# Patient Record
Sex: Male | Born: 1965
Health system: Southern US, Community
[De-identification: ages and names within clinical notes are randomized; demographics above are authoritative.]

## PROBLEM LIST (undated history)

## (undated) DIAGNOSIS — E119 Type 2 diabetes mellitus without complications: Secondary | ICD-10-CM

---

## 2006-07-01 ENCOUNTER — Emergency Department (HOSPITAL_COMMUNITY): Admission: EM | Admit: 2006-07-01 | Discharge: 2006-07-01 | Payer: Self-pay | Admitting: Emergency Medicine

## 2010-02-27 ENCOUNTER — Emergency Department (HOSPITAL_COMMUNITY): Admission: EM | Admit: 2010-02-27 | Discharge: 2010-02-27 | Payer: Self-pay | Admitting: Family Medicine

## 2010-07-02 LAB — POCT URINALYSIS DIPSTICK
Glucose, UA: NEGATIVE mg/dL
Hgb urine dipstick: NEGATIVE
Nitrite: NEGATIVE
Protein, ur: NEGATIVE mg/dL
Urobilinogen, UA: 0.2 mg/dL (ref 0.0–1.0)

## 2010-07-02 LAB — POCT I-STAT, CHEM 8
Glucose, Bld: 122 mg/dL — ABNORMAL HIGH (ref 70–99)
HCT: 44 % (ref 39.0–52.0)
Hemoglobin: 15 g/dL (ref 13.0–17.0)
Potassium: 4 mEq/L (ref 3.5–5.1)
TCO2: 28 mmol/L (ref 0–100)

## 2010-07-02 LAB — HEMOCCULT GUIAC POC 1CARD (OFFICE): Fecal Occult Bld: POSITIVE

## 2011-06-28 SURGERY — APPENDECTOMY, LAPAROSCOPIC
Anesthesia: General

## 2012-10-28 ENCOUNTER — Other Ambulatory Visit: Payer: Self-pay | Admitting: Internal Medicine

## 2012-10-28 DIAGNOSIS — C220 Liver cell carcinoma: Secondary | ICD-10-CM

## 2012-11-04 ENCOUNTER — Other Ambulatory Visit: Payer: Self-pay

## 2012-11-08 ENCOUNTER — Ambulatory Visit
Admission: RE | Admit: 2012-11-08 | Discharge: 2012-11-08 | Disposition: A | Discharge status: Home / Self Care | Payer: No Typology Code available for payment source | Source: Ambulatory Visit | Attending: Internal Medicine | Admitting: Internal Medicine

## 2012-11-08 DIAGNOSIS — C220 Liver cell carcinoma: Secondary | ICD-10-CM

## 2015-01-01 DIAGNOSIS — B181 Chronic viral hepatitis B without delta-agent: Secondary | ICD-10-CM | POA: Diagnosis not present

## 2015-01-01 DIAGNOSIS — B182 Chronic viral hepatitis C: Secondary | ICD-10-CM | POA: Diagnosis not present

## 2015-01-03 ENCOUNTER — Other Ambulatory Visit (HOSPITAL_COMMUNITY): Payer: Self-pay | Admitting: Nurse Practitioner

## 2015-01-03 DIAGNOSIS — B182 Chronic viral hepatitis C: Secondary | ICD-10-CM

## 2015-01-03 DIAGNOSIS — B181 Chronic viral hepatitis B without delta-agent: Secondary | ICD-10-CM

## 2015-01-24 ENCOUNTER — Ambulatory Visit (HOSPITAL_COMMUNITY)
Admission: RE | Admit: 2015-01-24 | Discharge: 2015-01-24 | Disposition: A | Discharge status: Home / Self Care | Payer: Medicare Other | Source: Ambulatory Visit | Attending: Nurse Practitioner | Admitting: Nurse Practitioner

## 2015-01-24 DIAGNOSIS — B182 Chronic viral hepatitis C: Secondary | ICD-10-CM | POA: Insufficient documentation

## 2015-01-24 DIAGNOSIS — B181 Chronic viral hepatitis B without delta-agent: Secondary | ICD-10-CM | POA: Insufficient documentation

## 2015-02-06 DIAGNOSIS — B1920 Unspecified viral hepatitis C without hepatic coma: Secondary | ICD-10-CM | POA: Diagnosis not present

## 2015-02-06 DIAGNOSIS — F329 Major depressive disorder, single episode, unspecified: Secondary | ICD-10-CM | POA: Diagnosis not present

## 2015-02-06 DIAGNOSIS — E1165 Type 2 diabetes mellitus with hyperglycemia: Secondary | ICD-10-CM | POA: Diagnosis not present

## 2015-02-06 DIAGNOSIS — G629 Polyneuropathy, unspecified: Secondary | ICD-10-CM | POA: Diagnosis not present

## 2015-02-06 DIAGNOSIS — F4310 Post-traumatic stress disorder, unspecified: Secondary | ICD-10-CM | POA: Diagnosis not present

## 2015-02-06 DIAGNOSIS — Z23 Encounter for immunization: Secondary | ICD-10-CM | POA: Diagnosis not present

## 2015-02-21 ENCOUNTER — Ambulatory Visit (HOSPITAL_COMMUNITY): Payer: Self-pay

## 2015-05-11 DIAGNOSIS — F4310 Post-traumatic stress disorder, unspecified: Secondary | ICD-10-CM | POA: Diagnosis not present

## 2015-05-11 DIAGNOSIS — E119 Type 2 diabetes mellitus without complications: Secondary | ICD-10-CM | POA: Diagnosis not present

## 2015-05-11 DIAGNOSIS — B1920 Unspecified viral hepatitis C without hepatic coma: Secondary | ICD-10-CM | POA: Diagnosis not present

## 2015-05-11 DIAGNOSIS — Z7984 Long term (current) use of oral hypoglycemic drugs: Secondary | ICD-10-CM | POA: Diagnosis not present

## 2015-05-11 DIAGNOSIS — G629 Polyneuropathy, unspecified: Secondary | ICD-10-CM | POA: Diagnosis not present

## 2015-06-11 DIAGNOSIS — H524 Presbyopia: Secondary | ICD-10-CM | POA: Diagnosis not present

## 2015-06-11 DIAGNOSIS — E119 Type 2 diabetes mellitus without complications: Secondary | ICD-10-CM | POA: Diagnosis not present

## 2015-11-09 DIAGNOSIS — E1165 Type 2 diabetes mellitus with hyperglycemia: Secondary | ICD-10-CM | POA: Diagnosis not present

## 2015-12-04 ENCOUNTER — Other Ambulatory Visit: Payer: Self-pay | Admitting: Nurse Practitioner

## 2015-12-04 DIAGNOSIS — B181 Chronic viral hepatitis B without delta-agent: Secondary | ICD-10-CM | POA: Diagnosis not present

## 2015-12-04 DIAGNOSIS — K740 Hepatic fibrosis: Secondary | ICD-10-CM | POA: Diagnosis not present

## 2015-12-04 DIAGNOSIS — B182 Chronic viral hepatitis C: Secondary | ICD-10-CM | POA: Diagnosis not present

## 2015-12-05 ENCOUNTER — Other Ambulatory Visit: Payer: Self-pay | Admitting: Nurse Practitioner

## 2015-12-05 DIAGNOSIS — B1911 Unspecified viral hepatitis B with hepatic coma: Secondary | ICD-10-CM

## 2015-12-17 ENCOUNTER — Ambulatory Visit
Admission: RE | Admit: 2015-12-17 | Discharge: 2015-12-17 | Disposition: A | Discharge status: Home / Self Care | Payer: Medicare Other | Source: Ambulatory Visit | Attending: Nurse Practitioner | Admitting: Nurse Practitioner

## 2015-12-17 DIAGNOSIS — B1910 Unspecified viral hepatitis B without hepatic coma: Secondary | ICD-10-CM | POA: Diagnosis not present

## 2015-12-17 DIAGNOSIS — B1911 Unspecified viral hepatitis B with hepatic coma: Secondary | ICD-10-CM

## 2016-01-03 DIAGNOSIS — B181 Chronic viral hepatitis B without delta-agent: Secondary | ICD-10-CM | POA: Diagnosis not present

## 2016-01-03 DIAGNOSIS — K740 Hepatic fibrosis: Secondary | ICD-10-CM | POA: Diagnosis not present

## 2016-01-03 DIAGNOSIS — B182 Chronic viral hepatitis C: Secondary | ICD-10-CM | POA: Diagnosis not present

## 2016-02-15 DIAGNOSIS — B182 Chronic viral hepatitis C: Secondary | ICD-10-CM | POA: Diagnosis not present

## 2016-02-19 DIAGNOSIS — B182 Chronic viral hepatitis C: Secondary | ICD-10-CM | POA: Diagnosis not present

## 2016-02-19 DIAGNOSIS — B181 Chronic viral hepatitis B without delta-agent: Secondary | ICD-10-CM | POA: Diagnosis not present

## 2016-03-04 DIAGNOSIS — B182 Chronic viral hepatitis C: Secondary | ICD-10-CM | POA: Diagnosis not present

## 2016-03-04 DIAGNOSIS — B181 Chronic viral hepatitis B without delta-agent: Secondary | ICD-10-CM | POA: Diagnosis not present

## 2016-03-04 DIAGNOSIS — K740 Hepatic fibrosis: Secondary | ICD-10-CM | POA: Diagnosis not present

## 2016-03-19 DIAGNOSIS — B182 Chronic viral hepatitis C: Secondary | ICD-10-CM | POA: Diagnosis not present

## 2016-04-03 DIAGNOSIS — B182 Chronic viral hepatitis C: Secondary | ICD-10-CM | POA: Diagnosis not present

## 2016-06-06 DIAGNOSIS — E1165 Type 2 diabetes mellitus with hyperglycemia: Secondary | ICD-10-CM | POA: Diagnosis not present

## 2016-06-06 DIAGNOSIS — F4310 Post-traumatic stress disorder, unspecified: Secondary | ICD-10-CM | POA: Diagnosis not present

## 2016-06-06 DIAGNOSIS — E119 Type 2 diabetes mellitus without complications: Secondary | ICD-10-CM | POA: Diagnosis not present

## 2016-06-06 DIAGNOSIS — G629 Polyneuropathy, unspecified: Secondary | ICD-10-CM | POA: Diagnosis not present

## 2016-06-06 DIAGNOSIS — F329 Major depressive disorder, single episode, unspecified: Secondary | ICD-10-CM | POA: Diagnosis not present

## 2016-06-06 DIAGNOSIS — Z7984 Long term (current) use of oral hypoglycemic drugs: Secondary | ICD-10-CM | POA: Diagnosis not present

## 2016-06-23 DIAGNOSIS — E119 Type 2 diabetes mellitus without complications: Secondary | ICD-10-CM | POA: Diagnosis not present

## 2016-06-23 DIAGNOSIS — H524 Presbyopia: Secondary | ICD-10-CM | POA: Diagnosis not present

## 2016-07-17 ENCOUNTER — Other Ambulatory Visit: Payer: Self-pay | Admitting: Nurse Practitioner

## 2016-07-17 DIAGNOSIS — B182 Chronic viral hepatitis C: Secondary | ICD-10-CM | POA: Diagnosis not present

## 2016-07-17 DIAGNOSIS — B181 Chronic viral hepatitis B without delta-agent: Secondary | ICD-10-CM | POA: Diagnosis not present

## 2016-07-17 DIAGNOSIS — K740 Hepatic fibrosis: Secondary | ICD-10-CM | POA: Diagnosis not present

## 2016-07-25 ENCOUNTER — Ambulatory Visit
Admission: RE | Admit: 2016-07-25 | Discharge: 2016-07-25 | Disposition: A | Discharge status: Home / Self Care | Payer: Medicare Other | Source: Ambulatory Visit | Attending: Nurse Practitioner | Admitting: Nurse Practitioner

## 2016-07-25 DIAGNOSIS — B181 Chronic viral hepatitis B without delta-agent: Secondary | ICD-10-CM | POA: Diagnosis not present

## 2016-11-19 DIAGNOSIS — Z125 Encounter for screening for malignant neoplasm of prostate: Secondary | ICD-10-CM | POA: Diagnosis not present

## 2016-11-19 DIAGNOSIS — G629 Polyneuropathy, unspecified: Secondary | ICD-10-CM | POA: Diagnosis not present

## 2016-11-19 DIAGNOSIS — F4310 Post-traumatic stress disorder, unspecified: Secondary | ICD-10-CM | POA: Diagnosis not present

## 2016-11-19 DIAGNOSIS — B1920 Unspecified viral hepatitis C without hepatic coma: Secondary | ICD-10-CM | POA: Diagnosis not present

## 2016-11-19 DIAGNOSIS — E119 Type 2 diabetes mellitus without complications: Secondary | ICD-10-CM | POA: Diagnosis not present

## 2016-11-19 DIAGNOSIS — Z1211 Encounter for screening for malignant neoplasm of colon: Secondary | ICD-10-CM | POA: Diagnosis not present

## 2016-11-19 DIAGNOSIS — Z0000 Encounter for general adult medical examination without abnormal findings: Secondary | ICD-10-CM | POA: Diagnosis not present

## 2016-11-19 DIAGNOSIS — F329 Major depressive disorder, single episode, unspecified: Secondary | ICD-10-CM | POA: Diagnosis not present

## 2016-12-25 DIAGNOSIS — B182 Chronic viral hepatitis C: Secondary | ICD-10-CM | POA: Diagnosis not present

## 2016-12-25 DIAGNOSIS — B181 Chronic viral hepatitis B without delta-agent: Secondary | ICD-10-CM | POA: Diagnosis not present

## 2016-12-25 DIAGNOSIS — K740 Hepatic fibrosis: Secondary | ICD-10-CM | POA: Diagnosis not present

## 2016-12-26 ENCOUNTER — Other Ambulatory Visit: Payer: Self-pay | Admitting: Nurse Practitioner

## 2016-12-26 DIAGNOSIS — K7400 Hepatic fibrosis, unspecified: Secondary | ICD-10-CM

## 2016-12-26 DIAGNOSIS — B181 Chronic viral hepatitis B without delta-agent: Secondary | ICD-10-CM

## 2017-01-07 ENCOUNTER — Other Ambulatory Visit: Payer: Medicare Other

## 2017-01-20 ENCOUNTER — Ambulatory Visit
Admission: RE | Admit: 2017-01-20 | Discharge: 2017-01-20 | Disposition: A | Discharge status: Home / Self Care | Payer: Medicare Other | Source: Ambulatory Visit | Attending: Nurse Practitioner | Admitting: Nurse Practitioner

## 2017-01-20 DIAGNOSIS — K7400 Hepatic fibrosis, unspecified: Secondary | ICD-10-CM

## 2017-01-20 DIAGNOSIS — B181 Chronic viral hepatitis B without delta-agent: Secondary | ICD-10-CM | POA: Diagnosis not present

## 2017-01-26 DIAGNOSIS — B181 Chronic viral hepatitis B without delta-agent: Secondary | ICD-10-CM | POA: Diagnosis not present

## 2017-01-26 DIAGNOSIS — B182 Chronic viral hepatitis C: Secondary | ICD-10-CM | POA: Diagnosis not present

## 2017-02-20 DIAGNOSIS — K648 Other hemorrhoids: Secondary | ICD-10-CM | POA: Diagnosis not present

## 2017-02-20 DIAGNOSIS — K621 Rectal polyp: Secondary | ICD-10-CM | POA: Diagnosis not present

## 2017-02-20 DIAGNOSIS — Z1211 Encounter for screening for malignant neoplasm of colon: Secondary | ICD-10-CM | POA: Diagnosis not present

## 2017-02-24 DIAGNOSIS — Z1211 Encounter for screening for malignant neoplasm of colon: Secondary | ICD-10-CM | POA: Diagnosis not present

## 2017-02-24 DIAGNOSIS — K621 Rectal polyp: Secondary | ICD-10-CM | POA: Diagnosis not present

## 2017-03-06 DIAGNOSIS — Z23 Encounter for immunization: Secondary | ICD-10-CM | POA: Diagnosis not present

## 2017-03-18 DIAGNOSIS — B182 Chronic viral hepatitis C: Secondary | ICD-10-CM | POA: Diagnosis not present

## 2017-03-18 DIAGNOSIS — B181 Chronic viral hepatitis B without delta-agent: Secondary | ICD-10-CM | POA: Diagnosis not present

## 2017-03-25 DIAGNOSIS — B182 Chronic viral hepatitis C: Secondary | ICD-10-CM | POA: Diagnosis not present

## 2017-03-25 DIAGNOSIS — B181 Chronic viral hepatitis B without delta-agent: Secondary | ICD-10-CM | POA: Diagnosis not present

## 2017-04-28 DIAGNOSIS — B182 Chronic viral hepatitis C: Secondary | ICD-10-CM | POA: Diagnosis not present

## 2017-04-28 DIAGNOSIS — B181 Chronic viral hepatitis B without delta-agent: Secondary | ICD-10-CM | POA: Diagnosis not present

## 2017-05-08 DIAGNOSIS — B181 Chronic viral hepatitis B without delta-agent: Secondary | ICD-10-CM | POA: Diagnosis not present

## 2017-05-08 DIAGNOSIS — B182 Chronic viral hepatitis C: Secondary | ICD-10-CM | POA: Diagnosis not present

## 2017-07-01 DIAGNOSIS — G629 Polyneuropathy, unspecified: Secondary | ICD-10-CM | POA: Diagnosis not present

## 2017-07-01 DIAGNOSIS — Z7984 Long term (current) use of oral hypoglycemic drugs: Secondary | ICD-10-CM | POA: Diagnosis not present

## 2017-07-01 DIAGNOSIS — F329 Major depressive disorder, single episode, unspecified: Secondary | ICD-10-CM | POA: Diagnosis not present

## 2017-07-01 DIAGNOSIS — E119 Type 2 diabetes mellitus without complications: Secondary | ICD-10-CM | POA: Diagnosis not present

## 2017-07-01 DIAGNOSIS — E7800 Pure hypercholesterolemia, unspecified: Secondary | ICD-10-CM | POA: Diagnosis not present

## 2017-07-02 DIAGNOSIS — H538 Other visual disturbances: Secondary | ICD-10-CM | POA: Diagnosis not present

## 2017-07-02 DIAGNOSIS — E119 Type 2 diabetes mellitus without complications: Secondary | ICD-10-CM | POA: Diagnosis not present

## 2017-07-21 DIAGNOSIS — B181 Chronic viral hepatitis B without delta-agent: Secondary | ICD-10-CM | POA: Diagnosis not present

## 2017-07-21 DIAGNOSIS — B182 Chronic viral hepatitis C: Secondary | ICD-10-CM | POA: Diagnosis not present

## 2017-07-31 DIAGNOSIS — B182 Chronic viral hepatitis C: Secondary | ICD-10-CM | POA: Diagnosis not present

## 2017-07-31 DIAGNOSIS — K740 Hepatic fibrosis: Secondary | ICD-10-CM | POA: Diagnosis not present

## 2017-07-31 DIAGNOSIS — B181 Chronic viral hepatitis B without delta-agent: Secondary | ICD-10-CM | POA: Diagnosis not present

## 2017-08-03 ENCOUNTER — Other Ambulatory Visit: Payer: Self-pay | Admitting: Nurse Practitioner

## 2017-08-03 DIAGNOSIS — B181 Chronic viral hepatitis B without delta-agent: Secondary | ICD-10-CM

## 2017-08-13 ENCOUNTER — Other Ambulatory Visit: Payer: Medicare Other

## 2017-08-13 ENCOUNTER — Ambulatory Visit
Admission: RE | Admit: 2017-08-13 | Discharge: 2017-08-13 | Disposition: A | Discharge status: Home / Self Care | Payer: Medicare Other | Source: Ambulatory Visit | Attending: Nurse Practitioner | Admitting: Nurse Practitioner

## 2017-08-13 DIAGNOSIS — B181 Chronic viral hepatitis B without delta-agent: Secondary | ICD-10-CM

## 2017-11-23 DIAGNOSIS — B1920 Unspecified viral hepatitis C without hepatic coma: Secondary | ICD-10-CM | POA: Diagnosis not present

## 2017-11-23 DIAGNOSIS — F329 Major depressive disorder, single episode, unspecified: Secondary | ICD-10-CM | POA: Diagnosis not present

## 2017-11-23 DIAGNOSIS — E7800 Pure hypercholesterolemia, unspecified: Secondary | ICD-10-CM | POA: Diagnosis not present

## 2017-11-23 DIAGNOSIS — G629 Polyneuropathy, unspecified: Secondary | ICD-10-CM | POA: Diagnosis not present

## 2017-11-23 DIAGNOSIS — Z0000 Encounter for general adult medical examination without abnormal findings: Secondary | ICD-10-CM | POA: Diagnosis not present

## 2017-11-23 DIAGNOSIS — L299 Pruritus, unspecified: Secondary | ICD-10-CM | POA: Diagnosis not present

## 2017-11-23 DIAGNOSIS — E1165 Type 2 diabetes mellitus with hyperglycemia: Secondary | ICD-10-CM | POA: Diagnosis not present

## 2018-01-22 DIAGNOSIS — B181 Chronic viral hepatitis B without delta-agent: Secondary | ICD-10-CM | POA: Diagnosis not present

## 2018-01-22 DIAGNOSIS — B182 Chronic viral hepatitis C: Secondary | ICD-10-CM | POA: Diagnosis not present

## 2018-01-22 DIAGNOSIS — K740 Hepatic fibrosis: Secondary | ICD-10-CM | POA: Diagnosis not present

## 2018-03-30 DIAGNOSIS — B181 Chronic viral hepatitis B without delta-agent: Secondary | ICD-10-CM | POA: Diagnosis not present

## 2018-04-01 ENCOUNTER — Other Ambulatory Visit: Payer: Self-pay | Admitting: Nurse Practitioner

## 2018-04-01 DIAGNOSIS — B181 Chronic viral hepatitis B without delta-agent: Secondary | ICD-10-CM

## 2018-04-07 ENCOUNTER — Ambulatory Visit
Admission: RE | Admit: 2018-04-07 | Discharge: 2018-04-07 | Disposition: A | Discharge status: Home / Self Care | Payer: Medicare Other | Source: Ambulatory Visit | Attending: Nurse Practitioner | Admitting: Nurse Practitioner

## 2018-04-07 DIAGNOSIS — B181 Chronic viral hepatitis B without delta-agent: Secondary | ICD-10-CM | POA: Diagnosis not present

## 2018-05-26 DIAGNOSIS — E119 Type 2 diabetes mellitus without complications: Secondary | ICD-10-CM | POA: Diagnosis not present

## 2018-05-26 DIAGNOSIS — E7800 Pure hypercholesterolemia, unspecified: Secondary | ICD-10-CM | POA: Diagnosis not present

## 2018-05-26 DIAGNOSIS — Z23 Encounter for immunization: Secondary | ICD-10-CM | POA: Diagnosis not present

## 2018-05-26 DIAGNOSIS — F329 Major depressive disorder, single episode, unspecified: Secondary | ICD-10-CM | POA: Diagnosis not present

## 2018-05-26 DIAGNOSIS — G629 Polyneuropathy, unspecified: Secondary | ICD-10-CM | POA: Diagnosis not present

## 2018-07-05 DIAGNOSIS — H538 Other visual disturbances: Secondary | ICD-10-CM | POA: Diagnosis not present

## 2018-07-05 DIAGNOSIS — E119 Type 2 diabetes mellitus without complications: Secondary | ICD-10-CM | POA: Diagnosis not present

## 2018-07-07 ENCOUNTER — Other Ambulatory Visit: Payer: Self-pay

## 2018-07-07 ENCOUNTER — Encounter (HOSPITAL_COMMUNITY): Payer: Self-pay | Admitting: Emergency Medicine

## 2018-07-07 ENCOUNTER — Ambulatory Visit (HOSPITAL_COMMUNITY)
Admission: EM | Admit: 2018-07-07 | Discharge: 2018-07-07 | Disposition: A | Discharge status: Home / Self Care | Payer: Medicaid Other | Attending: Family Medicine | Admitting: Family Medicine

## 2018-07-07 DIAGNOSIS — J309 Allergic rhinitis, unspecified: Secondary | ICD-10-CM | POA: Diagnosis not present

## 2018-07-07 DIAGNOSIS — R05 Cough: Secondary | ICD-10-CM

## 2018-07-07 DIAGNOSIS — R21 Rash and other nonspecific skin eruption: Secondary | ICD-10-CM | POA: Diagnosis not present

## 2018-07-07 HISTORY — DX: Type 2 diabetes mellitus without complications: E11.9

## 2018-07-07 MED ORDER — CETIRIZINE-PSEUDOEPHEDRINE ER 5-120 MG PO TB12: 1.0000 | ORAL_TABLET | Freq: Every day | ORAL | 0 refills | Status: AC

## 2018-07-07 MED ORDER — PERMETHRIN 5 % EX CREA: TOPICAL_CREAM | CUTANEOUS | 1 refills | Status: AC

## 2018-07-07 MED ORDER — FLUTICASONE PROPIONATE 50 MCG/ACT NA SUSP: 1.0000 | Freq: Every day | NASAL | 2 refills | Status: AC

## 2018-07-07 MED ORDER — SALINE SPRAY 0.65 % NA SOLN
1.0000 | NASAL | 0 refills | Status: AC | PRN
Start: 2018-07-07 — End: ?

## 2018-07-07 NOTE — ED Triage Notes (Signed)
Pt c/o cough x5 days, and states sometimes he has nose bleeds.

## 2018-07-07 NOTE — ED Provider Notes (Signed)
[facility]    CSN: [id number] Arrival date & time: 07/07/18  0957     History   Chief Complaint Chief Complaint  Patient presents with  . Cough    HPI Drew Mathews is a 53 y.o. male.   Patient is a 53 year old male with past medical history of diabetes.  He presents with approximately 5 days of dry cough.  He is also had a scratchy, itchy throat, nasal congestion, rhinorrhea and mild nosebleeding.  Symptoms have been constant but waxing and waning.  He has not been take anything for his symptoms.  Denies any cough, chest congestion, fevers, chills, fatigue, night sweats, myalgias.  Denies any recent traveling or recent sick contacts.  Patient also has rash to bilateral lower extremities and left flank area.  This is been present over the past couple months.  Describes the rash as itchy.  He has not been putting anything on the rash.  He does a lot of of fishing and is outside a lot.   Denies any fever, joint pain. Denies any recent changes in lotions, detergents, foods or other possible irritants. No recent travel. Nobody else at home has the rash. Patient has been outside but denies any contact with plants or insects. No new foods or medications.   ROS per HPI      Cough    Past Medical History:  Diagnosis Date  . Diabetes mellitus without complication ([place])     There are no active problems to display for this patient.   History reviewed. No pertinent surgical history.     Home Medications    Prior to Admission medications   Medication Sig Start Date End Date Taking? Authorizing Provider  glipiZIDE (GLUCOTROL) 10 MG tablet Take by mouth. 09/26/13  Yes [provider]  cetirizine-pseudoephedrine (ZYRTEC-D) 5-120 MG tablet Take 1 tablet by mouth daily. 07/07/18   Zeddie Njie, Tressia Miners A, NP  fluticasone (FLONASE) 50 MCG/ACT nasal spray Place 1 spray into both nostrils daily. 07/07/18   Alfhild Partch, Tressia Miners A, NP  gabapentin (NEURONTIN) 300 MG capsule TAKE 1 CAPSULE BY  MOUTH THREE TIMES A DAY 05/26/18   [provider]  metFORMIN (GLUCOPHAGE) 1000 MG tablet Take by mouth.    [provider]  permethrin (ELIMITE) 5 % cream Apply to affected area once 07/07/18   Loura Halt A, NP  pravastatin (PRAVACHOL) 10 MG tablet Take 10 mg by mouth daily. 05/24/18   [provider]  sertraline (ZOLOFT) 100 MG tablet Take by mouth.    [provider]  sodium chloride (OCEAN) 0.65 % SOLN nasal spray Place 1 spray into both nostrils as needed for congestion. 07/07/18   Orvan July, NP    Family History Family History  Family history unknown: Yes    Social History Social History   Tobacco Use  . Smoking status: Never Smoker  Substance Use Topics  . Alcohol use: Never    Frequency: Never  . Drug use: Never     Allergies   Patient has no known allergies.   Review of Systems Review of Systems  Respiratory: Positive for cough.      Physical Exam Triage Vital Signs ED Triage Vitals  Enc Vitals Group     BP 07/07/18 1048 (!) 97/51     Pulse Rate 07/07/18 1048 69     Resp 07/07/18 1048 16     Temp 07/07/18 1048 97.7 F (36.5 C)     Temp Source 07/07/18 1048 Oral  SpO2 07/07/18 1048 100 %     Weight --      Height --      Head Circumference --      Peak Flow --      Pain Score 07/07/18 1054 6     Pain Loc --      Pain Edu? --      Excl. in [place]? --    No data found.  Updated Vital Signs BP (!) 97/51 (BP Location: Left Arm)   Pulse 69   Temp 97.7 F (36.5 C) (Oral)   Resp 16   SpO2 100%   Visual Acuity Right Eye Distance:   Left Eye Distance:   Bilateral Distance:    Right Eye Near:   Left Eye Near:    Bilateral Near:     Physical Exam Vitals signs and nursing note reviewed.  Constitutional:      General: He is not in acute distress.    Appearance: Normal appearance. He is normal weight. He is not ill-appearing, toxic-appearing or diaphoretic.  HENT:     Head: Normocephalic and atraumatic.      Right Ear: Tympanic membrane and ear canal normal.     Left Ear: Tympanic membrane and ear canal normal.     Nose: Congestion and rhinorrhea present.     Right Turbinates: Swollen.     Left Turbinates: Swollen and pale.     Mouth/Throat:     Comments: PND Eyes:     Conjunctiva/sclera: Conjunctivae normal.  Neck:     Musculoskeletal: Normal range of motion.  Cardiovascular:     Rate and Rhythm: Normal rate and regular rhythm.     Pulses: Normal pulses.     Heart sounds: Normal heart sounds.  Pulmonary:     Effort: Pulmonary effort is normal.     Breath sounds: Normal breath sounds.  Musculoskeletal: Normal range of motion.  Lymphadenopathy:     Cervical: No cervical adenopathy.  Skin:    General: Skin is warm and dry.     Findings: Rash present.     Comments: Located to bilateral lower extremities and left flank Rash on left flank papular with some pustules. Rash to bilateral lower extremities a mixture of papules, pustules and excoriations.  Neurological:     Mental Status: He is alert.  Psychiatric:        Mood and Affect: Mood normal.      UC Treatments / Results  Labs (all labs ordered are listed, but only abnormal results are displayed) Labs Reviewed - No data to display  EKG None  Radiology No results found.  Procedures Procedures (including critical care time)  Medications Ordered in UC Medications - No data to display  Initial Impression / Assessment and Plan / UC Course  I have reviewed the triage vital signs and the nursing notes.  Pertinent labs & imaging results that were available during my care of the patient were reviewed by me and considered in my medical decision making (see chart for details).     Symptoms consistent with allergic rhinitis Flonase and Zyrtec-D for symptoms Nasal saline spray for mucosal irritation and dryness  Rash consistent with scabies infestation We will treat with permethrin cream Instructions on how to use given  Follow up as needed for continued or worsening symptoms   Final Clinical Impressions(s) / UC Diagnoses   Final diagnoses:  Allergic rhinitis, unspecified seasonality, unspecified trigger  Rash and nonspecific skin eruption     Discharge Instructions  I believe that your symptoms are allergy related.  Zyrtec D for nasal congestion, runny nose and congestion.  Flonase nasal spray for nasal congestion and allergies You could also use some nasal saline spray to help moisten the nasal passages and prevent nose bleeding  I will give you some cream for the rash.  You need to apply the cream and leave it on 8 hours and then wash off You can repeat this in 2 weeks if the rash is still present Follow up as needed for continued or worsening symptoms    ED Prescriptions    Medication Sig Dispense Auth. Provider   fluticasone (FLONASE) 50 MCG/ACT nasal spray Place 1 spray into both nostrils daily. 16 g Hailey Stormer A, NP   cetirizine-pseudoephedrine (ZYRTEC-D) 5-120 MG tablet Take 1 tablet by mouth daily. 30 tablet Kaliya Shreiner A, NP   sodium chloride (OCEAN) 0.65 % SOLN nasal spray Place 1 spray into both nostrils as needed for congestion. 1 Bottle Ashtynn Berke A, NP   permethrin (ELIMITE) 5 % cream Apply to affected area once 60 g Loura Halt A, NP     Controlled Substance Prescriptions [place] Controlled Substance Registry consulted? Not Applicable   Orvan July, NP 07/07/18 1143

## 2018-07-07 NOTE — Discharge Instructions (Signed)
I believe that your symptoms are allergy related.  Zyrtec D for nasal congestion, runny nose and congestion.  Flonase nasal spray for nasal congestion and allergies You could also use some nasal saline spray to help moisten the nasal passages and prevent nose bleeding  I will give you some cream for the rash.  You need to apply the cream and leave it on 8 hours and then wash off You can repeat this in 2 weeks if the rash is still present Follow up as needed for continued or worsening symptoms

## 2018-11-09 ENCOUNTER — Other Ambulatory Visit: Payer: Self-pay | Admitting: Nurse Practitioner

## 2018-11-09 DIAGNOSIS — B181 Chronic viral hepatitis B without delta-agent: Secondary | ICD-10-CM | POA: Diagnosis not present

## 2018-11-09 DIAGNOSIS — R21 Rash and other nonspecific skin eruption: Secondary | ICD-10-CM | POA: Diagnosis not present

## 2018-11-24 ENCOUNTER — Ambulatory Visit
Admission: RE | Admit: 2018-11-24 | Discharge: 2018-11-24 | Disposition: A | Discharge status: Home / Self Care | Payer: Medicare HMO | Source: Ambulatory Visit | Attending: Nurse Practitioner | Admitting: Nurse Practitioner

## 2018-11-24 DIAGNOSIS — R932 Abnormal findings on diagnostic imaging of liver and biliary tract: Secondary | ICD-10-CM | POA: Diagnosis not present

## 2018-11-24 DIAGNOSIS — B181 Chronic viral hepatitis B without delta-agent: Secondary | ICD-10-CM

## 2018-11-26 DIAGNOSIS — Z0000 Encounter for general adult medical examination without abnormal findings: Secondary | ICD-10-CM | POA: Diagnosis not present

## 2018-11-26 DIAGNOSIS — G629 Polyneuropathy, unspecified: Secondary | ICD-10-CM | POA: Diagnosis not present

## 2018-11-26 DIAGNOSIS — R21 Rash and other nonspecific skin eruption: Secondary | ICD-10-CM | POA: Diagnosis not present

## 2018-11-26 DIAGNOSIS — F329 Major depressive disorder, single episode, unspecified: Secondary | ICD-10-CM | POA: Diagnosis not present

## 2018-11-26 DIAGNOSIS — E7800 Pure hypercholesterolemia, unspecified: Secondary | ICD-10-CM | POA: Diagnosis not present

## 2018-11-26 DIAGNOSIS — Z7984 Long term (current) use of oral hypoglycemic drugs: Secondary | ICD-10-CM | POA: Diagnosis not present

## 2018-11-26 DIAGNOSIS — B1920 Unspecified viral hepatitis C without hepatic coma: Secondary | ICD-10-CM | POA: Diagnosis not present

## 2018-11-26 DIAGNOSIS — E1169 Type 2 diabetes mellitus with other specified complication: Secondary | ICD-10-CM | POA: Diagnosis not present

## 2018-11-26 DIAGNOSIS — I10 Essential (primary) hypertension: Secondary | ICD-10-CM | POA: Diagnosis not present

## 2018-12-30 DIAGNOSIS — Z20828 Contact with and (suspected) exposure to other viral communicable diseases: Secondary | ICD-10-CM | POA: Diagnosis not present

## 2019-01-13 DIAGNOSIS — E7800 Pure hypercholesterolemia, unspecified: Secondary | ICD-10-CM | POA: Diagnosis not present

## 2019-01-13 DIAGNOSIS — Z7984 Long term (current) use of oral hypoglycemic drugs: Secondary | ICD-10-CM | POA: Diagnosis not present

## 2019-01-13 DIAGNOSIS — I10 Essential (primary) hypertension: Secondary | ICD-10-CM | POA: Diagnosis not present

## 2019-01-13 DIAGNOSIS — F329 Major depressive disorder, single episode, unspecified: Secondary | ICD-10-CM | POA: Diagnosis not present

## 2019-01-13 DIAGNOSIS — E1169 Type 2 diabetes mellitus with other specified complication: Secondary | ICD-10-CM | POA: Diagnosis not present

## 2019-01-19 DIAGNOSIS — Z20828 Contact with and (suspected) exposure to other viral communicable diseases: Secondary | ICD-10-CM | POA: Diagnosis not present

## 2019-02-18 DIAGNOSIS — Z7984 Long term (current) use of oral hypoglycemic drugs: Secondary | ICD-10-CM | POA: Diagnosis not present

## 2019-02-18 DIAGNOSIS — I10 Essential (primary) hypertension: Secondary | ICD-10-CM | POA: Diagnosis not present

## 2019-02-18 DIAGNOSIS — F329 Major depressive disorder, single episode, unspecified: Secondary | ICD-10-CM | POA: Diagnosis not present

## 2019-02-18 DIAGNOSIS — E7800 Pure hypercholesterolemia, unspecified: Secondary | ICD-10-CM | POA: Diagnosis not present

## 2019-02-18 DIAGNOSIS — E1169 Type 2 diabetes mellitus with other specified complication: Secondary | ICD-10-CM | POA: Diagnosis not present

## 2019-03-02 DIAGNOSIS — E7800 Pure hypercholesterolemia, unspecified: Secondary | ICD-10-CM | POA: Diagnosis not present

## 2019-03-02 DIAGNOSIS — E1169 Type 2 diabetes mellitus with other specified complication: Secondary | ICD-10-CM | POA: Diagnosis not present

## 2019-03-02 DIAGNOSIS — I10 Essential (primary) hypertension: Secondary | ICD-10-CM | POA: Diagnosis not present

## 2019-03-02 DIAGNOSIS — F329 Major depressive disorder, single episode, unspecified: Secondary | ICD-10-CM | POA: Diagnosis not present

## 2019-03-29 DIAGNOSIS — L281 Prurigo nodularis: Secondary | ICD-10-CM | POA: Diagnosis not present

## 2019-04-19 DIAGNOSIS — E7800 Pure hypercholesterolemia, unspecified: Secondary | ICD-10-CM | POA: Diagnosis not present

## 2019-04-19 DIAGNOSIS — Z7984 Long term (current) use of oral hypoglycemic drugs: Secondary | ICD-10-CM | POA: Diagnosis not present

## 2019-04-19 DIAGNOSIS — I10 Essential (primary) hypertension: Secondary | ICD-10-CM | POA: Diagnosis not present

## 2019-04-19 DIAGNOSIS — F329 Major depressive disorder, single episode, unspecified: Secondary | ICD-10-CM | POA: Diagnosis not present

## 2019-04-19 DIAGNOSIS — E1169 Type 2 diabetes mellitus with other specified complication: Secondary | ICD-10-CM | POA: Diagnosis not present

## 2019-05-05 DIAGNOSIS — I10 Essential (primary) hypertension: Secondary | ICD-10-CM | POA: Diagnosis not present

## 2019-05-05 DIAGNOSIS — F324 Major depressive disorder, single episode, in partial remission: Secondary | ICD-10-CM | POA: Diagnosis not present

## 2019-05-05 DIAGNOSIS — Z7984 Long term (current) use of oral hypoglycemic drugs: Secondary | ICD-10-CM | POA: Diagnosis not present

## 2019-05-05 DIAGNOSIS — E1169 Type 2 diabetes mellitus with other specified complication: Secondary | ICD-10-CM | POA: Diagnosis not present

## 2019-05-05 DIAGNOSIS — E7800 Pure hypercholesterolemia, unspecified: Secondary | ICD-10-CM | POA: Diagnosis not present

## 2019-06-01 DIAGNOSIS — E7800 Pure hypercholesterolemia, unspecified: Secondary | ICD-10-CM | POA: Diagnosis not present

## 2019-06-01 DIAGNOSIS — I10 Essential (primary) hypertension: Secondary | ICD-10-CM | POA: Diagnosis not present

## 2019-06-01 DIAGNOSIS — E1169 Type 2 diabetes mellitus with other specified complication: Secondary | ICD-10-CM | POA: Diagnosis not present

## 2019-06-01 DIAGNOSIS — Z7984 Long term (current) use of oral hypoglycemic drugs: Secondary | ICD-10-CM | POA: Diagnosis not present

## 2019-06-01 DIAGNOSIS — F324 Major depressive disorder, single episode, in partial remission: Secondary | ICD-10-CM | POA: Diagnosis not present

## 2019-06-06 DIAGNOSIS — B181 Chronic viral hepatitis B without delta-agent: Secondary | ICD-10-CM | POA: Diagnosis not present

## 2019-06-08 ENCOUNTER — Other Ambulatory Visit: Payer: Self-pay | Admitting: Nurse Practitioner

## 2019-06-08 DIAGNOSIS — B181 Chronic viral hepatitis B without delta-agent: Secondary | ICD-10-CM

## 2019-06-17 ENCOUNTER — Ambulatory Visit
Admission: RE | Admit: 2019-06-17 | Discharge: 2019-06-17 | Disposition: A | Discharge status: Home / Self Care | Payer: Medicare HMO | Source: Ambulatory Visit | Attending: Nurse Practitioner | Admitting: Nurse Practitioner

## 2019-06-17 DIAGNOSIS — K838 Other specified diseases of biliary tract: Secondary | ICD-10-CM | POA: Diagnosis not present

## 2019-06-17 DIAGNOSIS — B181 Chronic viral hepatitis B without delta-agent: Secondary | ICD-10-CM

## 2019-06-27 DIAGNOSIS — L281 Prurigo nodularis: Secondary | ICD-10-CM | POA: Diagnosis not present

## 2019-07-01 DIAGNOSIS — I10 Essential (primary) hypertension: Secondary | ICD-10-CM | POA: Diagnosis not present

## 2019-07-01 DIAGNOSIS — F324 Major depressive disorder, single episode, in partial remission: Secondary | ICD-10-CM | POA: Diagnosis not present

## 2019-07-01 DIAGNOSIS — E1169 Type 2 diabetes mellitus with other specified complication: Secondary | ICD-10-CM | POA: Diagnosis not present

## 2019-07-01 DIAGNOSIS — E7800 Pure hypercholesterolemia, unspecified: Secondary | ICD-10-CM | POA: Diagnosis not present

## 2019-07-01 DIAGNOSIS — Z7984 Long term (current) use of oral hypoglycemic drugs: Secondary | ICD-10-CM | POA: Diagnosis not present

## 2019-07-29 DIAGNOSIS — I10 Essential (primary) hypertension: Secondary | ICD-10-CM | POA: Diagnosis not present

## 2019-07-29 DIAGNOSIS — E1169 Type 2 diabetes mellitus with other specified complication: Secondary | ICD-10-CM | POA: Diagnosis not present

## 2019-07-29 DIAGNOSIS — E7800 Pure hypercholesterolemia, unspecified: Secondary | ICD-10-CM | POA: Diagnosis not present

## 2019-07-29 DIAGNOSIS — F324 Major depressive disorder, single episode, in partial remission: Secondary | ICD-10-CM | POA: Diagnosis not present

## 2019-07-29 DIAGNOSIS — Z7984 Long term (current) use of oral hypoglycemic drugs: Secondary | ICD-10-CM | POA: Diagnosis not present

## 2019-09-01 DIAGNOSIS — E7800 Pure hypercholesterolemia, unspecified: Secondary | ICD-10-CM | POA: Diagnosis not present

## 2019-09-01 DIAGNOSIS — E1169 Type 2 diabetes mellitus with other specified complication: Secondary | ICD-10-CM | POA: Diagnosis not present

## 2019-09-01 DIAGNOSIS — F324 Major depressive disorder, single episode, in partial remission: Secondary | ICD-10-CM | POA: Diagnosis not present

## 2019-09-01 DIAGNOSIS — I10 Essential (primary) hypertension: Secondary | ICD-10-CM | POA: Diagnosis not present

## 2019-09-16 DIAGNOSIS — H52203 Unspecified astigmatism, bilateral: Secondary | ICD-10-CM | POA: Diagnosis not present

## 2019-09-16 DIAGNOSIS — E119 Type 2 diabetes mellitus without complications: Secondary | ICD-10-CM | POA: Diagnosis not present

## 2019-10-07 DIAGNOSIS — E1169 Type 2 diabetes mellitus with other specified complication: Secondary | ICD-10-CM | POA: Diagnosis not present

## 2019-10-07 DIAGNOSIS — E7800 Pure hypercholesterolemia, unspecified: Secondary | ICD-10-CM | POA: Diagnosis not present

## 2019-10-07 DIAGNOSIS — F324 Major depressive disorder, single episode, in partial remission: Secondary | ICD-10-CM | POA: Diagnosis not present

## 2019-10-07 DIAGNOSIS — I10 Essential (primary) hypertension: Secondary | ICD-10-CM | POA: Diagnosis not present

## 2019-10-11 DIAGNOSIS — E1169 Type 2 diabetes mellitus with other specified complication: Secondary | ICD-10-CM | POA: Diagnosis not present

## 2019-11-04 DIAGNOSIS — I10 Essential (primary) hypertension: Secondary | ICD-10-CM | POA: Diagnosis not present

## 2019-11-04 DIAGNOSIS — E7800 Pure hypercholesterolemia, unspecified: Secondary | ICD-10-CM | POA: Diagnosis not present

## 2019-11-04 DIAGNOSIS — E1169 Type 2 diabetes mellitus with other specified complication: Secondary | ICD-10-CM | POA: Diagnosis not present

## 2019-11-04 DIAGNOSIS — F324 Major depressive disorder, single episode, in partial remission: Secondary | ICD-10-CM | POA: Diagnosis not present

## 2019-11-30 DIAGNOSIS — I10 Essential (primary) hypertension: Secondary | ICD-10-CM | POA: Diagnosis not present

## 2019-11-30 DIAGNOSIS — B1920 Unspecified viral hepatitis C without hepatic coma: Secondary | ICD-10-CM | POA: Diagnosis not present

## 2019-11-30 DIAGNOSIS — Z0000 Encounter for general adult medical examination without abnormal findings: Secondary | ICD-10-CM | POA: Diagnosis not present

## 2019-11-30 DIAGNOSIS — E1169 Type 2 diabetes mellitus with other specified complication: Secondary | ICD-10-CM | POA: Diagnosis not present

## 2019-11-30 DIAGNOSIS — E7800 Pure hypercholesterolemia, unspecified: Secondary | ICD-10-CM | POA: Diagnosis not present

## 2019-11-30 DIAGNOSIS — G629 Polyneuropathy, unspecified: Secondary | ICD-10-CM | POA: Diagnosis not present

## 2019-11-30 DIAGNOSIS — F324 Major depressive disorder, single episode, in partial remission: Secondary | ICD-10-CM | POA: Diagnosis not present

## 2019-11-30 DIAGNOSIS — F4310 Post-traumatic stress disorder, unspecified: Secondary | ICD-10-CM | POA: Diagnosis not present

## 2019-12-02 DIAGNOSIS — F324 Major depressive disorder, single episode, in partial remission: Secondary | ICD-10-CM | POA: Diagnosis not present

## 2019-12-02 DIAGNOSIS — E1169 Type 2 diabetes mellitus with other specified complication: Secondary | ICD-10-CM | POA: Diagnosis not present

## 2019-12-02 DIAGNOSIS — I10 Essential (primary) hypertension: Secondary | ICD-10-CM | POA: Diagnosis not present

## 2019-12-02 DIAGNOSIS — E7800 Pure hypercholesterolemia, unspecified: Secondary | ICD-10-CM | POA: Diagnosis not present

## 2019-12-05 ENCOUNTER — Other Ambulatory Visit: Payer: Self-pay | Admitting: Nurse Practitioner

## 2019-12-05 DIAGNOSIS — B181 Chronic viral hepatitis B without delta-agent: Secondary | ICD-10-CM | POA: Diagnosis not present

## 2019-12-05 DIAGNOSIS — K7400 Hepatic fibrosis, unspecified: Secondary | ICD-10-CM | POA: Diagnosis not present

## 2020-01-05 DIAGNOSIS — F324 Major depressive disorder, single episode, in partial remission: Secondary | ICD-10-CM | POA: Diagnosis not present

## 2020-01-05 DIAGNOSIS — E1169 Type 2 diabetes mellitus with other specified complication: Secondary | ICD-10-CM | POA: Diagnosis not present

## 2020-01-05 DIAGNOSIS — E7800 Pure hypercholesterolemia, unspecified: Secondary | ICD-10-CM | POA: Diagnosis not present

## 2020-01-05 DIAGNOSIS — I10 Essential (primary) hypertension: Secondary | ICD-10-CM | POA: Diagnosis not present

## 2020-01-20 ENCOUNTER — Ambulatory Visit
Admission: RE | Admit: 2020-01-20 | Discharge: 2020-01-20 | Disposition: A | Discharge status: Home / Self Care | Payer: Medicare HMO | Source: Ambulatory Visit | Attending: Nurse Practitioner | Admitting: Nurse Practitioner

## 2020-01-20 DIAGNOSIS — B181 Chronic viral hepatitis B without delta-agent: Secondary | ICD-10-CM

## 2020-01-20 DIAGNOSIS — B1910 Unspecified viral hepatitis B without hepatic coma: Secondary | ICD-10-CM | POA: Diagnosis not present

## 2020-02-16 DIAGNOSIS — I10 Essential (primary) hypertension: Secondary | ICD-10-CM | POA: Diagnosis not present

## 2020-02-16 DIAGNOSIS — F324 Major depressive disorder, single episode, in partial remission: Secondary | ICD-10-CM | POA: Diagnosis not present

## 2020-02-16 DIAGNOSIS — E7800 Pure hypercholesterolemia, unspecified: Secondary | ICD-10-CM | POA: Diagnosis not present

## 2020-02-16 DIAGNOSIS — E1169 Type 2 diabetes mellitus with other specified complication: Secondary | ICD-10-CM | POA: Diagnosis not present

## 2020-03-07 DIAGNOSIS — E1169 Type 2 diabetes mellitus with other specified complication: Secondary | ICD-10-CM | POA: Diagnosis not present

## 2020-03-07 DIAGNOSIS — I10 Essential (primary) hypertension: Secondary | ICD-10-CM | POA: Diagnosis not present

## 2020-03-07 DIAGNOSIS — F324 Major depressive disorder, single episode, in partial remission: Secondary | ICD-10-CM | POA: Diagnosis not present

## 2020-03-07 DIAGNOSIS — E7800 Pure hypercholesterolemia, unspecified: Secondary | ICD-10-CM | POA: Diagnosis not present

## 2020-03-27 DIAGNOSIS — I10 Essential (primary) hypertension: Secondary | ICD-10-CM | POA: Diagnosis not present

## 2020-03-27 DIAGNOSIS — E1169 Type 2 diabetes mellitus with other specified complication: Secondary | ICD-10-CM | POA: Diagnosis not present

## 2020-03-27 DIAGNOSIS — F324 Major depressive disorder, single episode, in partial remission: Secondary | ICD-10-CM | POA: Diagnosis not present

## 2020-03-27 DIAGNOSIS — E7800 Pure hypercholesterolemia, unspecified: Secondary | ICD-10-CM | POA: Diagnosis not present

## 2020-04-30 DIAGNOSIS — I10 Essential (primary) hypertension: Secondary | ICD-10-CM | POA: Diagnosis not present

## 2020-04-30 DIAGNOSIS — E1169 Type 2 diabetes mellitus with other specified complication: Secondary | ICD-10-CM | POA: Diagnosis not present

## 2020-04-30 DIAGNOSIS — F324 Major depressive disorder, single episode, in partial remission: Secondary | ICD-10-CM | POA: Diagnosis not present

## 2020-04-30 DIAGNOSIS — E7800 Pure hypercholesterolemia, unspecified: Secondary | ICD-10-CM | POA: Diagnosis not present

## 2020-05-25 DIAGNOSIS — M79661 Pain in right lower leg: Secondary | ICD-10-CM | POA: Diagnosis not present

## 2020-05-25 DIAGNOSIS — M5441 Lumbago with sciatica, right side: Secondary | ICD-10-CM | POA: Diagnosis not present

## 2020-05-30 DIAGNOSIS — F324 Major depressive disorder, single episode, in partial remission: Secondary | ICD-10-CM | POA: Diagnosis not present

## 2020-05-30 DIAGNOSIS — E1169 Type 2 diabetes mellitus with other specified complication: Secondary | ICD-10-CM | POA: Diagnosis not present

## 2020-05-30 DIAGNOSIS — E7800 Pure hypercholesterolemia, unspecified: Secondary | ICD-10-CM | POA: Diagnosis not present

## 2020-05-30 DIAGNOSIS — I10 Essential (primary) hypertension: Secondary | ICD-10-CM | POA: Diagnosis not present

## 2020-06-01 DIAGNOSIS — E7800 Pure hypercholesterolemia, unspecified: Secondary | ICD-10-CM | POA: Diagnosis not present

## 2020-06-01 DIAGNOSIS — F324 Major depressive disorder, single episode, in partial remission: Secondary | ICD-10-CM | POA: Diagnosis not present

## 2020-06-01 DIAGNOSIS — Z23 Encounter for immunization: Secondary | ICD-10-CM | POA: Diagnosis not present

## 2020-06-01 DIAGNOSIS — I10 Essential (primary) hypertension: Secondary | ICD-10-CM | POA: Diagnosis not present

## 2020-06-01 DIAGNOSIS — Z7984 Long term (current) use of oral hypoglycemic drugs: Secondary | ICD-10-CM | POA: Diagnosis not present

## 2020-06-01 DIAGNOSIS — E1169 Type 2 diabetes mellitus with other specified complication: Secondary | ICD-10-CM | POA: Diagnosis not present

## 2020-06-21 DIAGNOSIS — E7800 Pure hypercholesterolemia, unspecified: Secondary | ICD-10-CM | POA: Diagnosis not present

## 2020-06-21 DIAGNOSIS — E1169 Type 2 diabetes mellitus with other specified complication: Secondary | ICD-10-CM | POA: Diagnosis not present

## 2020-06-21 DIAGNOSIS — F324 Major depressive disorder, single episode, in partial remission: Secondary | ICD-10-CM | POA: Diagnosis not present

## 2020-06-21 DIAGNOSIS — I10 Essential (primary) hypertension: Secondary | ICD-10-CM | POA: Diagnosis not present

## 2020-07-04 DIAGNOSIS — K760 Fatty (change of) liver, not elsewhere classified: Secondary | ICD-10-CM | POA: Diagnosis not present

## 2020-07-04 DIAGNOSIS — B181 Chronic viral hepatitis B without delta-agent: Secondary | ICD-10-CM | POA: Diagnosis not present

## 2020-07-04 DIAGNOSIS — K7401 Hepatic fibrosis, early fibrosis: Secondary | ICD-10-CM | POA: Diagnosis not present

## 2020-07-05 ENCOUNTER — Other Ambulatory Visit: Payer: Self-pay | Admitting: Nurse Practitioner

## 2020-07-05 DIAGNOSIS — B181 Chronic viral hepatitis B without delta-agent: Secondary | ICD-10-CM

## 2020-07-20 ENCOUNTER — Ambulatory Visit
Admission: RE | Admit: 2020-07-20 | Discharge: 2020-07-20 | Disposition: A | Discharge status: Home / Self Care | Payer: Medicare HMO | Source: Ambulatory Visit | Attending: Nurse Practitioner | Admitting: Nurse Practitioner

## 2020-07-20 DIAGNOSIS — B181 Chronic viral hepatitis B without delta-agent: Secondary | ICD-10-CM | POA: Diagnosis not present

## 2020-07-25 DIAGNOSIS — E7800 Pure hypercholesterolemia, unspecified: Secondary | ICD-10-CM | POA: Diagnosis not present

## 2020-07-25 DIAGNOSIS — F324 Major depressive disorder, single episode, in partial remission: Secondary | ICD-10-CM | POA: Diagnosis not present

## 2020-07-25 DIAGNOSIS — E1169 Type 2 diabetes mellitus with other specified complication: Secondary | ICD-10-CM | POA: Diagnosis not present

## 2020-07-25 DIAGNOSIS — I10 Essential (primary) hypertension: Secondary | ICD-10-CM | POA: Diagnosis not present

## 2020-08-21 DIAGNOSIS — E1169 Type 2 diabetes mellitus with other specified complication: Secondary | ICD-10-CM | POA: Diagnosis not present

## 2020-08-21 DIAGNOSIS — I10 Essential (primary) hypertension: Secondary | ICD-10-CM | POA: Diagnosis not present

## 2020-08-21 DIAGNOSIS — F324 Major depressive disorder, single episode, in partial remission: Secondary | ICD-10-CM | POA: Diagnosis not present

## 2020-08-21 DIAGNOSIS — E7800 Pure hypercholesterolemia, unspecified: Secondary | ICD-10-CM | POA: Diagnosis not present

## 2020-09-27 DIAGNOSIS — F324 Major depressive disorder, single episode, in partial remission: Secondary | ICD-10-CM | POA: Diagnosis not present

## 2020-09-27 DIAGNOSIS — I10 Essential (primary) hypertension: Secondary | ICD-10-CM | POA: Diagnosis not present

## 2020-09-27 DIAGNOSIS — E7800 Pure hypercholesterolemia, unspecified: Secondary | ICD-10-CM | POA: Diagnosis not present

## 2020-09-27 DIAGNOSIS — E1169 Type 2 diabetes mellitus with other specified complication: Secondary | ICD-10-CM | POA: Diagnosis not present

## 2020-10-10 DIAGNOSIS — H5203 Hypermetropia, bilateral: Secondary | ICD-10-CM | POA: Diagnosis not present

## 2020-10-10 DIAGNOSIS — E119 Type 2 diabetes mellitus without complications: Secondary | ICD-10-CM | POA: Diagnosis not present

## 2020-10-10 DIAGNOSIS — H52203 Unspecified astigmatism, bilateral: Secondary | ICD-10-CM | POA: Diagnosis not present

## 2020-10-10 DIAGNOSIS — H524 Presbyopia: Secondary | ICD-10-CM | POA: Diagnosis not present

## 2020-10-24 DIAGNOSIS — I10 Essential (primary) hypertension: Secondary | ICD-10-CM | POA: Diagnosis not present

## 2020-10-24 DIAGNOSIS — F324 Major depressive disorder, single episode, in partial remission: Secondary | ICD-10-CM | POA: Diagnosis not present

## 2020-10-24 DIAGNOSIS — E1169 Type 2 diabetes mellitus with other specified complication: Secondary | ICD-10-CM | POA: Diagnosis not present

## 2020-10-24 DIAGNOSIS — E7800 Pure hypercholesterolemia, unspecified: Secondary | ICD-10-CM | POA: Diagnosis not present

## 2020-11-22 DIAGNOSIS — E1169 Type 2 diabetes mellitus with other specified complication: Secondary | ICD-10-CM | POA: Diagnosis not present

## 2020-11-22 DIAGNOSIS — I10 Essential (primary) hypertension: Secondary | ICD-10-CM | POA: Diagnosis not present

## 2020-11-22 DIAGNOSIS — F324 Major depressive disorder, single episode, in partial remission: Secondary | ICD-10-CM | POA: Diagnosis not present

## 2020-11-22 DIAGNOSIS — E7800 Pure hypercholesterolemia, unspecified: Secondary | ICD-10-CM | POA: Diagnosis not present

## 2020-11-22 DIAGNOSIS — K219 Gastro-esophageal reflux disease without esophagitis: Secondary | ICD-10-CM | POA: Diagnosis not present

## 2020-12-12 DIAGNOSIS — Z125 Encounter for screening for malignant neoplasm of prostate: Secondary | ICD-10-CM | POA: Diagnosis not present

## 2020-12-12 DIAGNOSIS — I10 Essential (primary) hypertension: Secondary | ICD-10-CM | POA: Diagnosis not present

## 2020-12-12 DIAGNOSIS — E1169 Type 2 diabetes mellitus with other specified complication: Secondary | ICD-10-CM | POA: Diagnosis not present

## 2020-12-12 DIAGNOSIS — Z0000 Encounter for general adult medical examination without abnormal findings: Secondary | ICD-10-CM | POA: Diagnosis not present

## 2020-12-12 DIAGNOSIS — G629 Polyneuropathy, unspecified: Secondary | ICD-10-CM | POA: Diagnosis not present

## 2020-12-12 DIAGNOSIS — E7800 Pure hypercholesterolemia, unspecified: Secondary | ICD-10-CM | POA: Diagnosis not present

## 2020-12-12 DIAGNOSIS — K219 Gastro-esophageal reflux disease without esophagitis: Secondary | ICD-10-CM | POA: Diagnosis not present

## 2020-12-12 DIAGNOSIS — F4310 Post-traumatic stress disorder, unspecified: Secondary | ICD-10-CM | POA: Diagnosis not present

## 2020-12-12 DIAGNOSIS — F324 Major depressive disorder, single episode, in partial remission: Secondary | ICD-10-CM | POA: Diagnosis not present

## 2020-12-12 DIAGNOSIS — Z23 Encounter for immunization: Secondary | ICD-10-CM | POA: Diagnosis not present

## 2020-12-12 DIAGNOSIS — Z7984 Long term (current) use of oral hypoglycemic drugs: Secondary | ICD-10-CM | POA: Diagnosis not present

## 2020-12-27 DIAGNOSIS — K219 Gastro-esophageal reflux disease without esophagitis: Secondary | ICD-10-CM | POA: Diagnosis not present

## 2020-12-27 DIAGNOSIS — E1169 Type 2 diabetes mellitus with other specified complication: Secondary | ICD-10-CM | POA: Diagnosis not present

## 2020-12-27 DIAGNOSIS — I10 Essential (primary) hypertension: Secondary | ICD-10-CM | POA: Diagnosis not present

## 2020-12-27 DIAGNOSIS — E7800 Pure hypercholesterolemia, unspecified: Secondary | ICD-10-CM | POA: Diagnosis not present

## 2020-12-27 DIAGNOSIS — F324 Major depressive disorder, single episode, in partial remission: Secondary | ICD-10-CM | POA: Diagnosis not present

## 2021-02-06 DIAGNOSIS — K760 Fatty (change of) liver, not elsewhere classified: Secondary | ICD-10-CM | POA: Diagnosis not present

## 2021-02-06 DIAGNOSIS — K7401 Hepatic fibrosis, early fibrosis: Secondary | ICD-10-CM | POA: Diagnosis not present

## 2021-02-06 DIAGNOSIS — B181 Chronic viral hepatitis B without delta-agent: Secondary | ICD-10-CM | POA: Diagnosis not present

## 2021-02-07 ENCOUNTER — Other Ambulatory Visit: Payer: Self-pay | Admitting: Nurse Practitioner

## 2021-02-07 DIAGNOSIS — K7401 Hepatic fibrosis, early fibrosis: Secondary | ICD-10-CM

## 2021-02-07 DIAGNOSIS — B181 Chronic viral hepatitis B without delta-agent: Secondary | ICD-10-CM

## 2021-02-07 DIAGNOSIS — K760 Fatty (change of) liver, not elsewhere classified: Secondary | ICD-10-CM

## 2021-02-12 DIAGNOSIS — K219 Gastro-esophageal reflux disease without esophagitis: Secondary | ICD-10-CM | POA: Diagnosis not present

## 2021-02-12 DIAGNOSIS — I10 Essential (primary) hypertension: Secondary | ICD-10-CM | POA: Diagnosis not present

## 2021-02-12 DIAGNOSIS — E7800 Pure hypercholesterolemia, unspecified: Secondary | ICD-10-CM | POA: Diagnosis not present

## 2021-02-12 DIAGNOSIS — E1169 Type 2 diabetes mellitus with other specified complication: Secondary | ICD-10-CM | POA: Diagnosis not present

## 2021-02-12 DIAGNOSIS — F324 Major depressive disorder, single episode, in partial remission: Secondary | ICD-10-CM | POA: Diagnosis not present

## 2021-02-19 DIAGNOSIS — E1169 Type 2 diabetes mellitus with other specified complication: Secondary | ICD-10-CM | POA: Diagnosis not present

## 2021-02-19 DIAGNOSIS — E7800 Pure hypercholesterolemia, unspecified: Secondary | ICD-10-CM | POA: Diagnosis not present

## 2021-02-19 DIAGNOSIS — I10 Essential (primary) hypertension: Secondary | ICD-10-CM | POA: Diagnosis not present

## 2021-02-19 DIAGNOSIS — F324 Major depressive disorder, single episode, in partial remission: Secondary | ICD-10-CM | POA: Diagnosis not present

## 2021-02-19 DIAGNOSIS — K219 Gastro-esophageal reflux disease without esophagitis: Secondary | ICD-10-CM | POA: Diagnosis not present

## 2021-03-04 ENCOUNTER — Ambulatory Visit
Admission: RE | Admit: 2021-03-04 | Discharge: 2021-03-04 | Disposition: A | Discharge status: Home / Self Care | Payer: Medicare HMO | Source: Ambulatory Visit | Attending: Nurse Practitioner | Admitting: Nurse Practitioner

## 2021-03-04 DIAGNOSIS — K760 Fatty (change of) liver, not elsewhere classified: Secondary | ICD-10-CM

## 2021-03-04 DIAGNOSIS — B1910 Unspecified viral hepatitis B without hepatic coma: Secondary | ICD-10-CM | POA: Diagnosis not present

## 2021-03-04 DIAGNOSIS — K7401 Hepatic fibrosis, early fibrosis: Secondary | ICD-10-CM

## 2021-03-04 DIAGNOSIS — B181 Chronic viral hepatitis B without delta-agent: Secondary | ICD-10-CM

## 2021-03-27 DIAGNOSIS — K219 Gastro-esophageal reflux disease without esophagitis: Secondary | ICD-10-CM | POA: Diagnosis not present

## 2021-03-27 DIAGNOSIS — E7800 Pure hypercholesterolemia, unspecified: Secondary | ICD-10-CM | POA: Diagnosis not present

## 2021-03-27 DIAGNOSIS — I10 Essential (primary) hypertension: Secondary | ICD-10-CM | POA: Diagnosis not present

## 2021-03-27 DIAGNOSIS — F324 Major depressive disorder, single episode, in partial remission: Secondary | ICD-10-CM | POA: Diagnosis not present

## 2021-03-27 DIAGNOSIS — E1169 Type 2 diabetes mellitus with other specified complication: Secondary | ICD-10-CM | POA: Diagnosis not present

## 2021-03-29 IMAGING — US US ABDOMEN LIMITED
1 series · 14 of 25 positions shown · non-contrast
Comparison: None.

CLINICAL DATA: Hepatitis B.

EXAM:
ULTRASOUND ABDOMEN LIMITED RIGHT UPPER QUADRANT

[Series 1: us abdomen limited · 0.17mm/px · 14 of 40 slices shown]
[im 1/40]
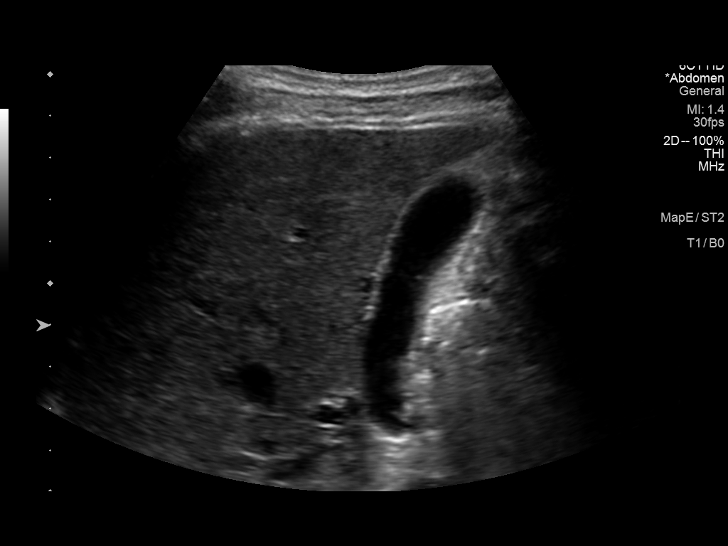
[im 4/40]
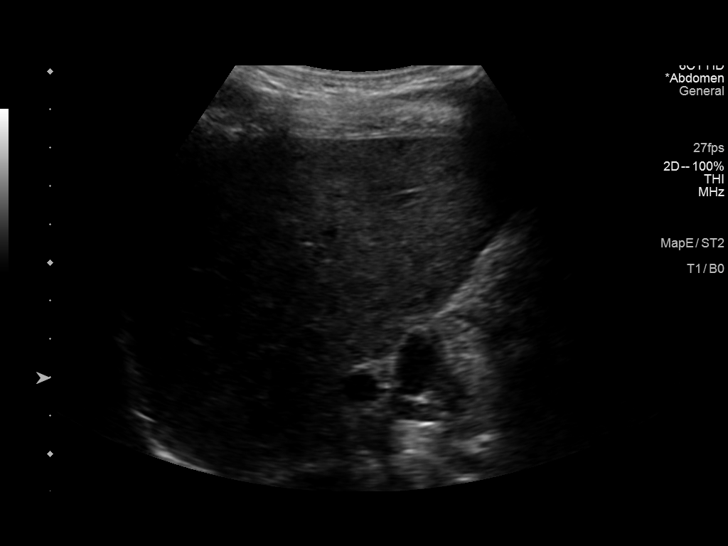
[im 7/40]
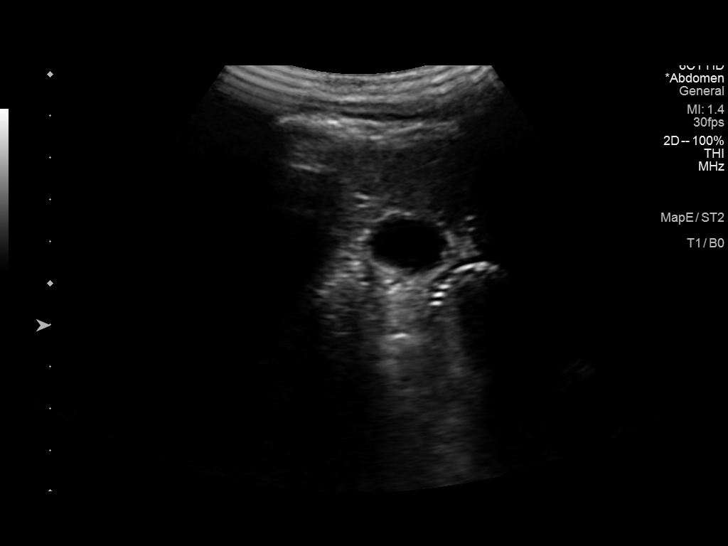
[im 10/40]
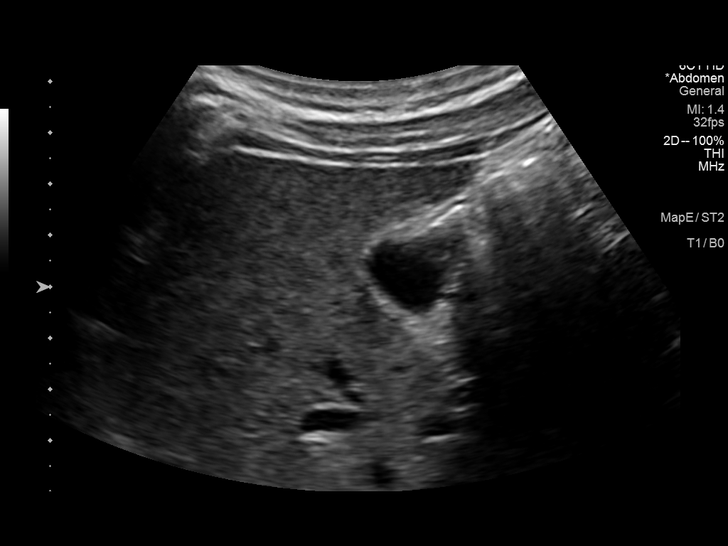
[im 14/40]
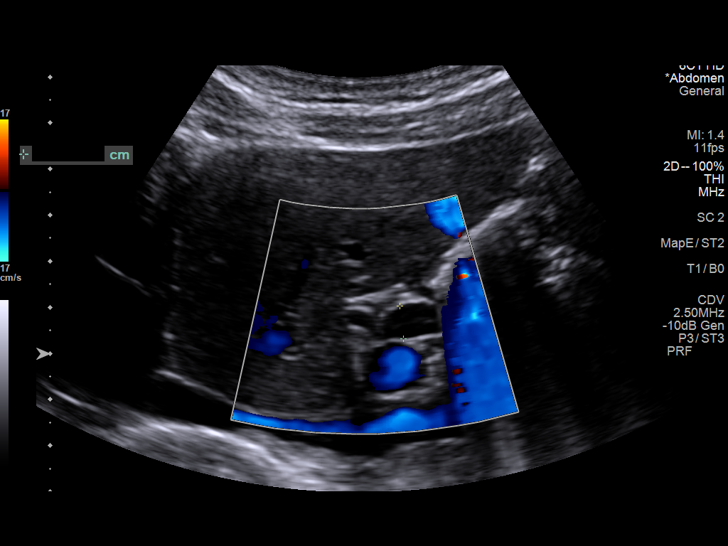
[im 15/40]
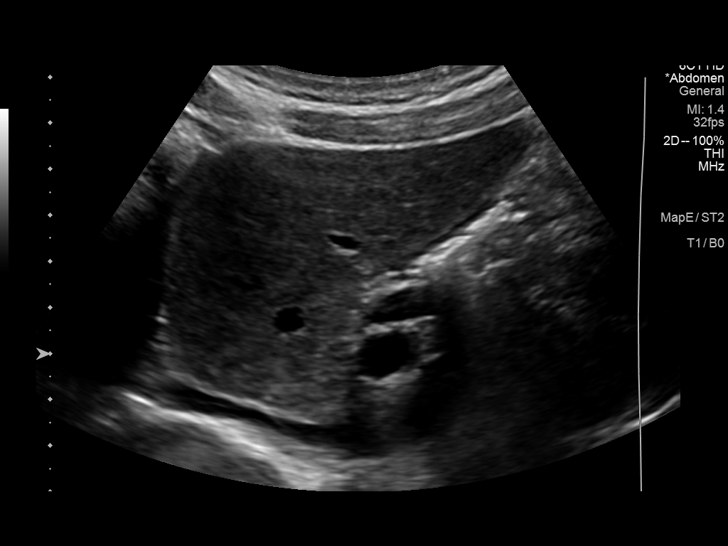
[im 18/40]
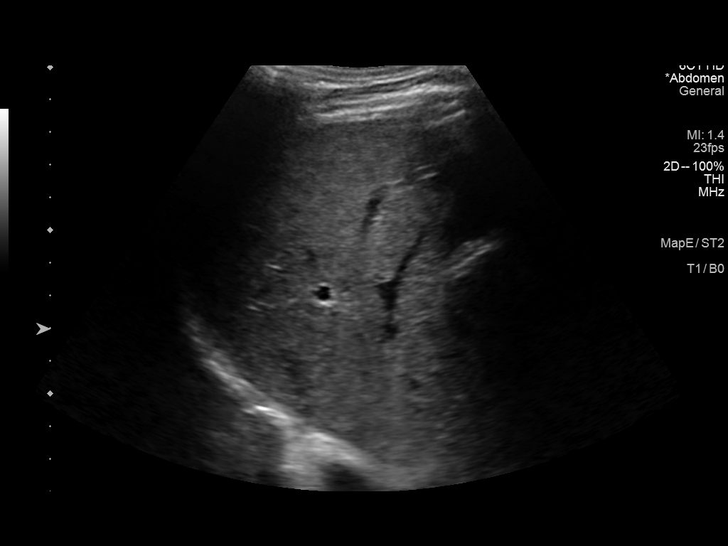
[im 22/40]
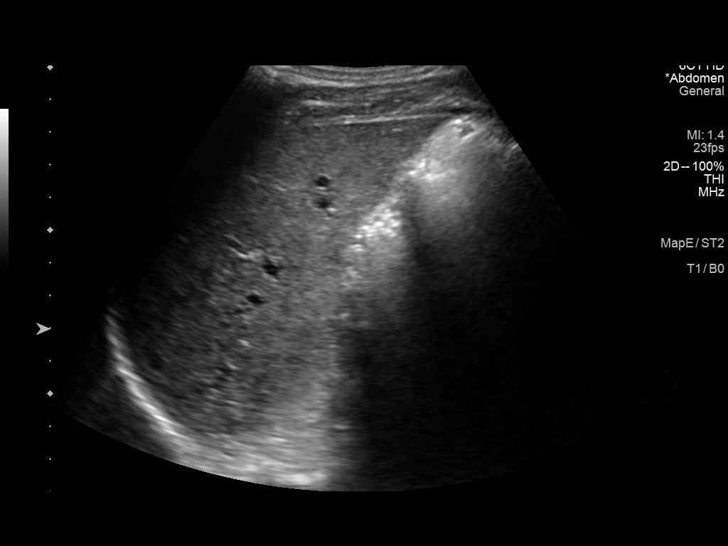
[im 25/40]
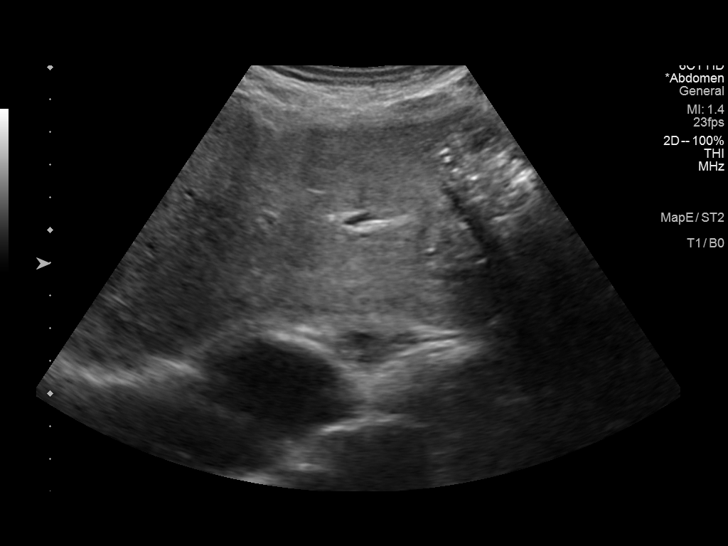
[im 27/40]
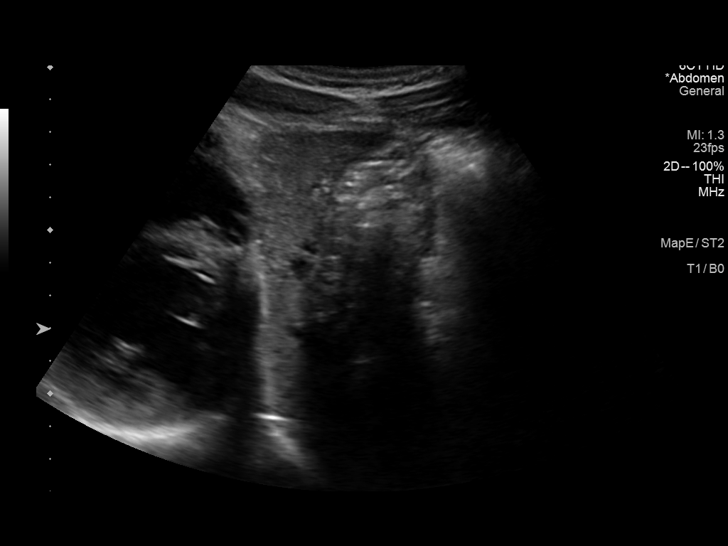
[im 30/40]
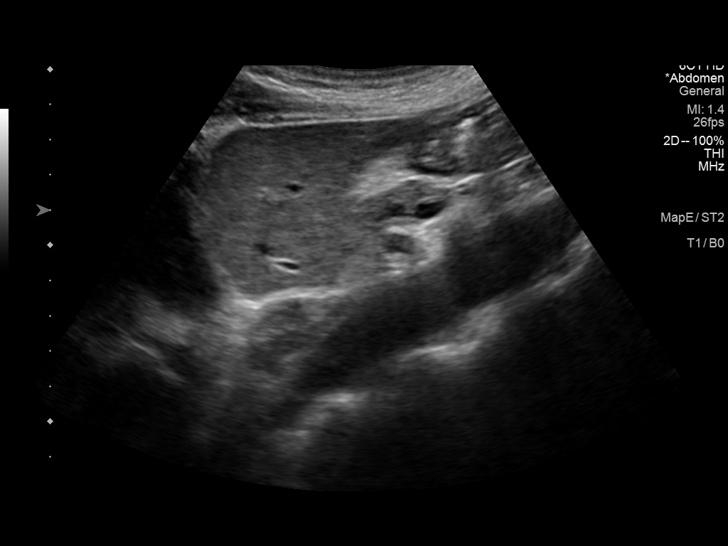
[im 33/40]
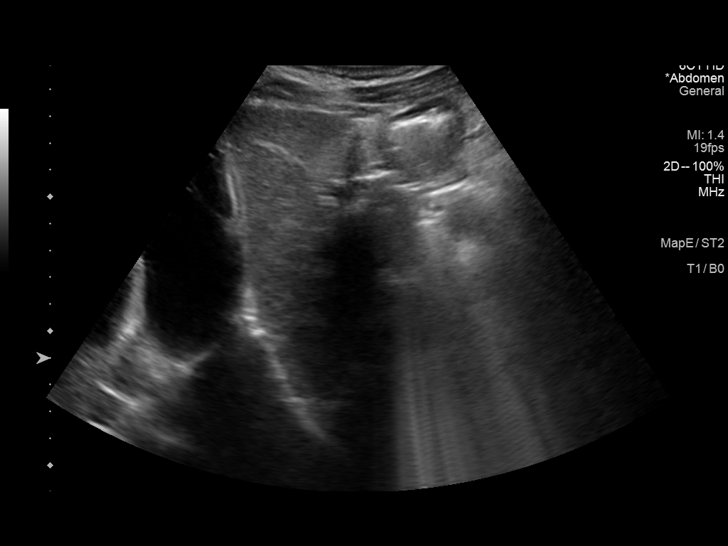
[im 36/40]
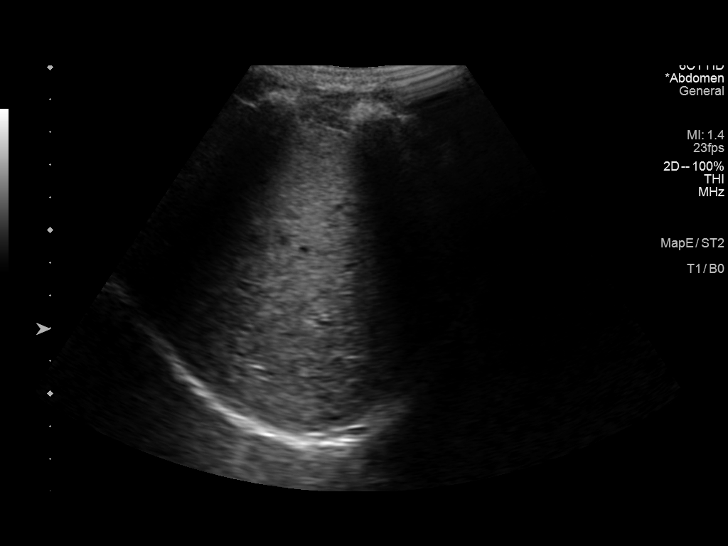
[im 40/40]
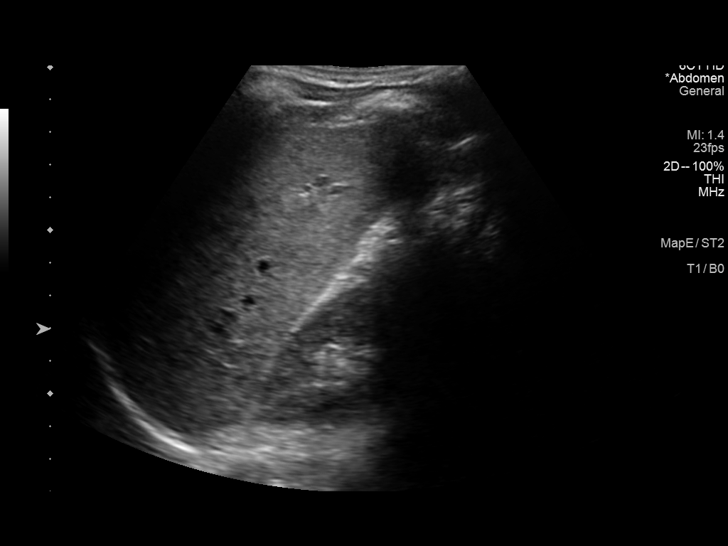

[14 of 25 positions shown; findings below may reference images not displayed]

FINDINGS: Gallbladder:

No gallstones or wall thickening visualized (2.1 mm). No sonographic
Murphy sign noted by sonographer.

Common bile duct:

Diameter: 7.1 mm

Liver:

No focal lesion identified. Within normal limits in parenchymal
echogenicity. Portal vein is patent on color Doppler imaging with
normal direction of blood flow towards the liver.

Other: None.
IMPRESSION: 1. Dilated common bile duct.
2. No evidence of cholelithiasis or acute cholecystitis.

## 2021-05-20 DIAGNOSIS — E7800 Pure hypercholesterolemia, unspecified: Secondary | ICD-10-CM | POA: Diagnosis not present

## 2021-05-20 DIAGNOSIS — F324 Major depressive disorder, single episode, in partial remission: Secondary | ICD-10-CM | POA: Diagnosis not present

## 2021-05-20 DIAGNOSIS — I10 Essential (primary) hypertension: Secondary | ICD-10-CM | POA: Diagnosis not present

## 2021-05-20 DIAGNOSIS — E1169 Type 2 diabetes mellitus with other specified complication: Secondary | ICD-10-CM | POA: Diagnosis not present

## 2021-05-20 DIAGNOSIS — K219 Gastro-esophageal reflux disease without esophagitis: Secondary | ICD-10-CM | POA: Diagnosis not present

## 2021-06-17 DIAGNOSIS — E7800 Pure hypercholesterolemia, unspecified: Secondary | ICD-10-CM | POA: Diagnosis not present

## 2021-06-17 DIAGNOSIS — I10 Essential (primary) hypertension: Secondary | ICD-10-CM | POA: Diagnosis not present

## 2021-06-17 DIAGNOSIS — F324 Major depressive disorder, single episode, in partial remission: Secondary | ICD-10-CM | POA: Diagnosis not present

## 2021-06-17 DIAGNOSIS — E1169 Type 2 diabetes mellitus with other specified complication: Secondary | ICD-10-CM | POA: Diagnosis not present

## 2021-07-05 DIAGNOSIS — E7800 Pure hypercholesterolemia, unspecified: Secondary | ICD-10-CM | POA: Diagnosis not present

## 2021-07-05 DIAGNOSIS — Z23 Encounter for immunization: Secondary | ICD-10-CM | POA: Diagnosis not present

## 2021-07-05 DIAGNOSIS — F324 Major depressive disorder, single episode, in partial remission: Secondary | ICD-10-CM | POA: Diagnosis not present

## 2021-07-05 DIAGNOSIS — E1169 Type 2 diabetes mellitus with other specified complication: Secondary | ICD-10-CM | POA: Diagnosis not present

## 2021-07-05 DIAGNOSIS — I10 Essential (primary) hypertension: Secondary | ICD-10-CM | POA: Diagnosis not present

## 2021-07-16 DIAGNOSIS — E1169 Type 2 diabetes mellitus with other specified complication: Secondary | ICD-10-CM | POA: Diagnosis not present

## 2021-07-16 DIAGNOSIS — E7800 Pure hypercholesterolemia, unspecified: Secondary | ICD-10-CM | POA: Diagnosis not present

## 2021-07-16 DIAGNOSIS — I10 Essential (primary) hypertension: Secondary | ICD-10-CM | POA: Diagnosis not present

## 2021-07-16 DIAGNOSIS — K219 Gastro-esophageal reflux disease without esophagitis: Secondary | ICD-10-CM | POA: Diagnosis not present

## 2021-08-08 ENCOUNTER — Other Ambulatory Visit: Payer: Self-pay | Admitting: Nurse Practitioner

## 2021-08-08 DIAGNOSIS — B181 Chronic viral hepatitis B without delta-agent: Secondary | ICD-10-CM

## 2021-08-14 DIAGNOSIS — E7800 Pure hypercholesterolemia, unspecified: Secondary | ICD-10-CM | POA: Diagnosis not present

## 2021-08-14 DIAGNOSIS — E1169 Type 2 diabetes mellitus with other specified complication: Secondary | ICD-10-CM | POA: Diagnosis not present

## 2021-08-14 DIAGNOSIS — I10 Essential (primary) hypertension: Secondary | ICD-10-CM | POA: Diagnosis not present

## 2021-08-26 ENCOUNTER — Ambulatory Visit
Admission: RE | Admit: 2021-08-26 | Discharge: 2021-08-26 | Disposition: A | Discharge status: Home / Self Care | Payer: Medicare HMO | Source: Ambulatory Visit | Attending: Nurse Practitioner | Admitting: Nurse Practitioner

## 2021-08-26 DIAGNOSIS — B1910 Unspecified viral hepatitis B without hepatic coma: Secondary | ICD-10-CM | POA: Diagnosis not present

## 2021-08-26 DIAGNOSIS — B181 Chronic viral hepatitis B without delta-agent: Secondary | ICD-10-CM

## 2021-09-12 DIAGNOSIS — E1169 Type 2 diabetes mellitus with other specified complication: Secondary | ICD-10-CM | POA: Diagnosis not present

## 2021-09-12 DIAGNOSIS — E7800 Pure hypercholesterolemia, unspecified: Secondary | ICD-10-CM | POA: Diagnosis not present

## 2021-09-12 DIAGNOSIS — I10 Essential (primary) hypertension: Secondary | ICD-10-CM | POA: Diagnosis not present

## 2021-10-16 DIAGNOSIS — E1169 Type 2 diabetes mellitus with other specified complication: Secondary | ICD-10-CM | POA: Diagnosis not present

## 2021-10-16 DIAGNOSIS — I10 Essential (primary) hypertension: Secondary | ICD-10-CM | POA: Diagnosis not present

## 2021-10-16 DIAGNOSIS — E7800 Pure hypercholesterolemia, unspecified: Secondary | ICD-10-CM | POA: Diagnosis not present

## 2022-01-06 DIAGNOSIS — Z0000 Encounter for general adult medical examination without abnormal findings: Secondary | ICD-10-CM | POA: Diagnosis not present

## 2022-01-06 DIAGNOSIS — M25511 Pain in right shoulder: Secondary | ICD-10-CM | POA: Diagnosis not present

## 2022-01-06 DIAGNOSIS — B1920 Unspecified viral hepatitis C without hepatic coma: Secondary | ICD-10-CM | POA: Diagnosis not present

## 2022-01-06 DIAGNOSIS — E7800 Pure hypercholesterolemia, unspecified: Secondary | ICD-10-CM | POA: Diagnosis not present

## 2022-01-06 DIAGNOSIS — Z125 Encounter for screening for malignant neoplasm of prostate: Secondary | ICD-10-CM | POA: Diagnosis not present

## 2022-01-06 DIAGNOSIS — E1169 Type 2 diabetes mellitus with other specified complication: Secondary | ICD-10-CM | POA: Diagnosis not present

## 2022-01-06 DIAGNOSIS — G629 Polyneuropathy, unspecified: Secondary | ICD-10-CM | POA: Diagnosis not present

## 2022-01-06 DIAGNOSIS — F324 Major depressive disorder, single episode, in partial remission: Secondary | ICD-10-CM | POA: Diagnosis not present

## 2022-01-06 DIAGNOSIS — F4310 Post-traumatic stress disorder, unspecified: Secondary | ICD-10-CM | POA: Diagnosis not present

## 2022-01-06 DIAGNOSIS — I10 Essential (primary) hypertension: Secondary | ICD-10-CM | POA: Diagnosis not present

## 2022-01-10 DIAGNOSIS — I10 Essential (primary) hypertension: Secondary | ICD-10-CM | POA: Diagnosis not present

## 2022-01-10 DIAGNOSIS — E1169 Type 2 diabetes mellitus with other specified complication: Secondary | ICD-10-CM | POA: Diagnosis not present

## 2022-01-10 DIAGNOSIS — E7800 Pure hypercholesterolemia, unspecified: Secondary | ICD-10-CM | POA: Diagnosis not present

## 2022-02-06 DIAGNOSIS — E7800 Pure hypercholesterolemia, unspecified: Secondary | ICD-10-CM | POA: Diagnosis not present

## 2022-02-06 DIAGNOSIS — I10 Essential (primary) hypertension: Secondary | ICD-10-CM | POA: Diagnosis not present

## 2022-02-06 DIAGNOSIS — E1169 Type 2 diabetes mellitus with other specified complication: Secondary | ICD-10-CM | POA: Diagnosis not present

## 2022-03-11 DIAGNOSIS — E1169 Type 2 diabetes mellitus with other specified complication: Secondary | ICD-10-CM | POA: Diagnosis not present

## 2022-03-11 DIAGNOSIS — E7800 Pure hypercholesterolemia, unspecified: Secondary | ICD-10-CM | POA: Diagnosis not present

## 2022-03-11 DIAGNOSIS — I10 Essential (primary) hypertension: Secondary | ICD-10-CM | POA: Diagnosis not present

## 2022-03-27 ENCOUNTER — Other Ambulatory Visit: Payer: Self-pay | Admitting: Nurse Practitioner

## 2022-03-27 DIAGNOSIS — R200 Anesthesia of skin: Secondary | ICD-10-CM | POA: Diagnosis not present

## 2022-03-27 DIAGNOSIS — B181 Chronic viral hepatitis B without delta-agent: Secondary | ICD-10-CM

## 2022-03-27 DIAGNOSIS — K760 Fatty (change of) liver, not elsewhere classified: Secondary | ICD-10-CM

## 2022-03-27 DIAGNOSIS — K7401 Hepatic fibrosis, early fibrosis: Secondary | ICD-10-CM | POA: Diagnosis not present

## 2022-04-03 DIAGNOSIS — I10 Essential (primary) hypertension: Secondary | ICD-10-CM | POA: Diagnosis not present

## 2022-04-03 DIAGNOSIS — E1169 Type 2 diabetes mellitus with other specified complication: Secondary | ICD-10-CM | POA: Diagnosis not present

## 2022-04-03 DIAGNOSIS — E7800 Pure hypercholesterolemia, unspecified: Secondary | ICD-10-CM | POA: Diagnosis not present

## 2022-04-11 ENCOUNTER — Other Ambulatory Visit: Payer: Medicare HMO

## 2022-05-01 DIAGNOSIS — H04123 Dry eye syndrome of bilateral lacrimal glands: Secondary | ICD-10-CM | POA: Diagnosis not present

## 2022-05-01 DIAGNOSIS — E119 Type 2 diabetes mellitus without complications: Secondary | ICD-10-CM | POA: Diagnosis not present

## 2022-05-06 DIAGNOSIS — E7800 Pure hypercholesterolemia, unspecified: Secondary | ICD-10-CM | POA: Diagnosis not present

## 2022-05-06 DIAGNOSIS — I10 Essential (primary) hypertension: Secondary | ICD-10-CM | POA: Diagnosis not present

## 2022-05-06 DIAGNOSIS — E1169 Type 2 diabetes mellitus with other specified complication: Secondary | ICD-10-CM | POA: Diagnosis not present

## 2022-05-09 ENCOUNTER — Ambulatory Visit
Admission: RE | Admit: 2022-05-09 | Discharge: 2022-05-09 | Disposition: A | Discharge status: Home / Self Care | Payer: Medicare HMO | Source: Ambulatory Visit | Attending: Nurse Practitioner | Admitting: Nurse Practitioner

## 2022-05-09 DIAGNOSIS — B181 Chronic viral hepatitis B without delta-agent: Secondary | ICD-10-CM

## 2022-05-09 DIAGNOSIS — K760 Fatty (change of) liver, not elsewhere classified: Secondary | ICD-10-CM

## 2022-05-09 DIAGNOSIS — B1910 Unspecified viral hepatitis B without hepatic coma: Secondary | ICD-10-CM | POA: Diagnosis not present

## 2022-05-12 IMAGING — US US ABDOMEN LIMITED
1 series · 14 of 25 positions shown · non-contrast
Comparison: Ultrasound abdomen 07/20/2020.

CLINICAL DATA: Chronic hepatitis-B.

EXAM:
ULTRASOUND ABDOMEN LIMITED RIGHT UPPER QUADRANT

[Series 1: us abdomen limited · 0.19mm/px · 14 of 53 slices shown]
[im 1/53]
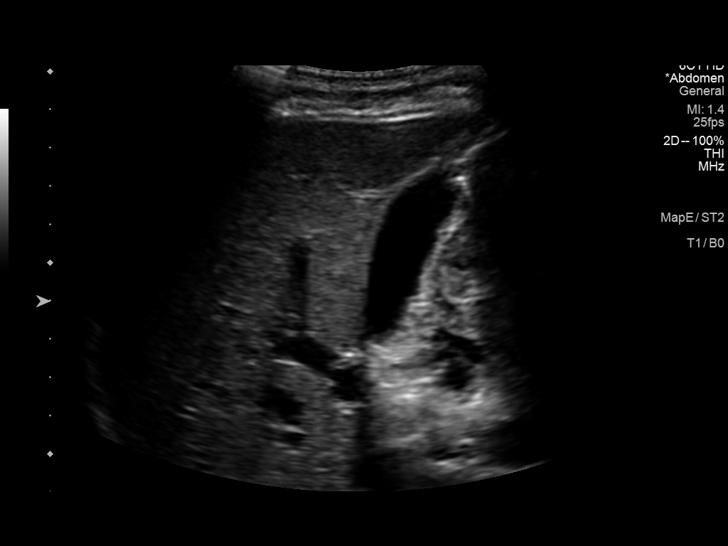
[im 5/53]
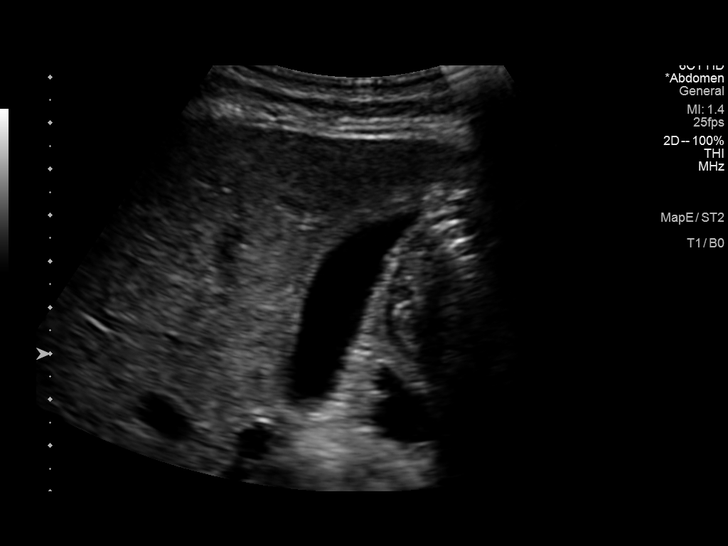
[im 9/53]
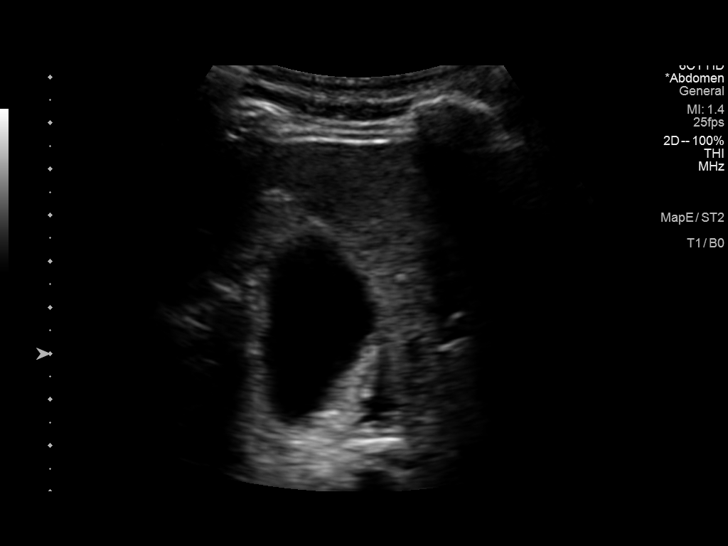
[im 14/53]
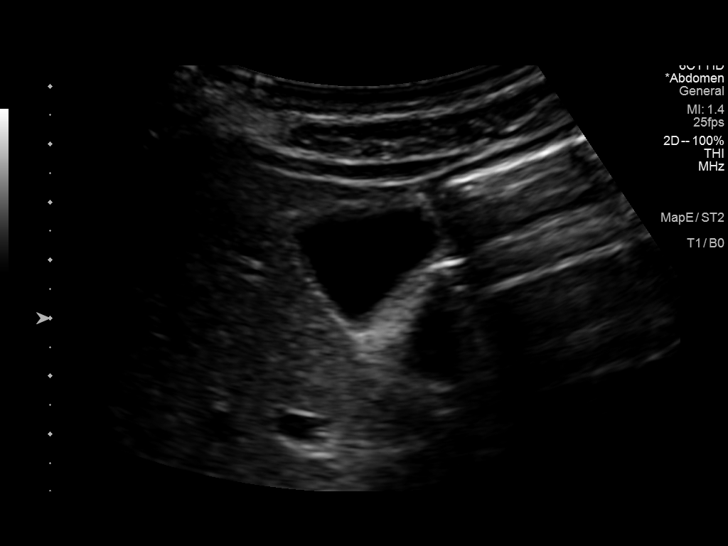
[im 18/53]
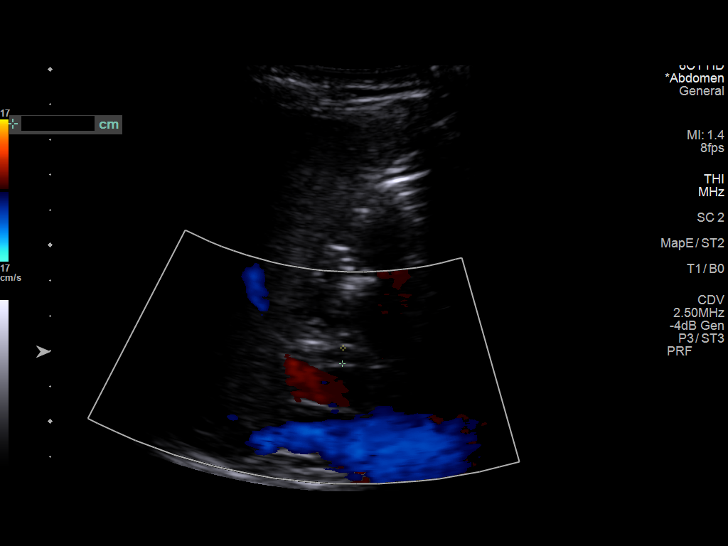
[im 20/53]
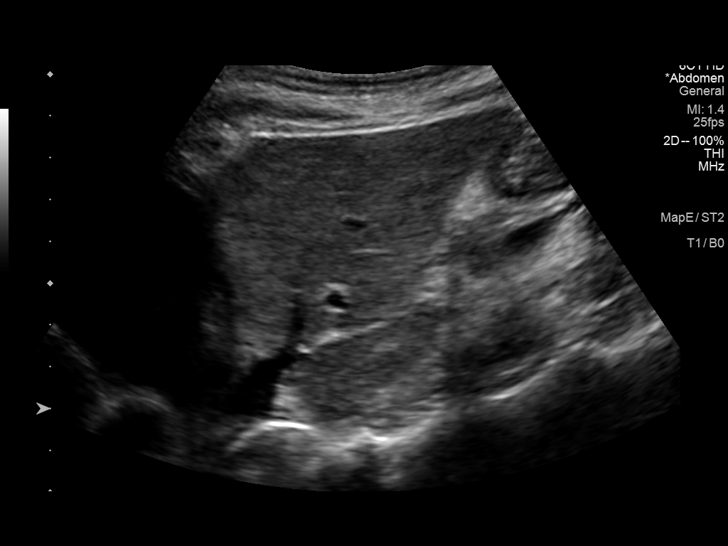
[im 24/53]
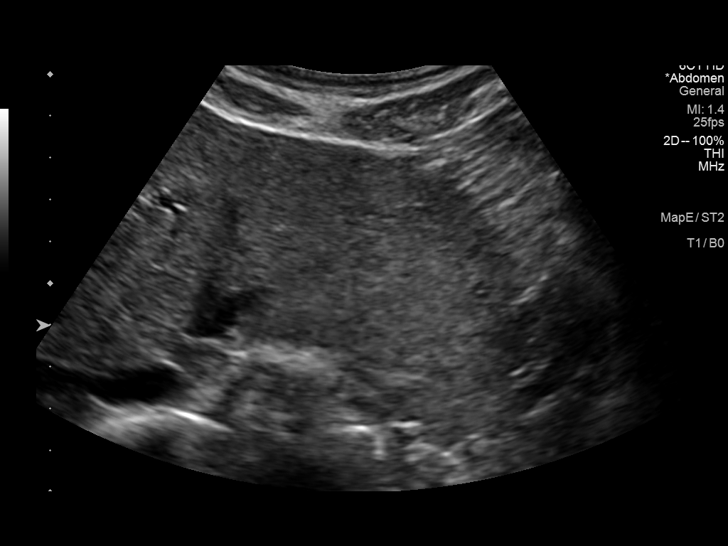
[im 29/53]
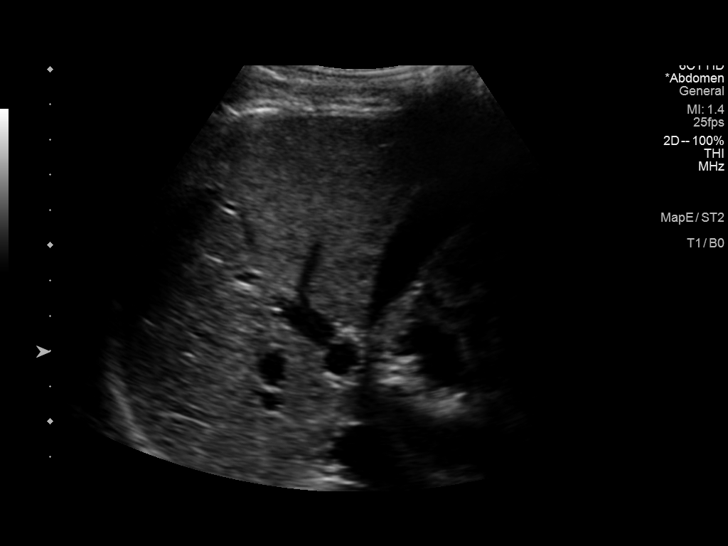
[im 33/53]
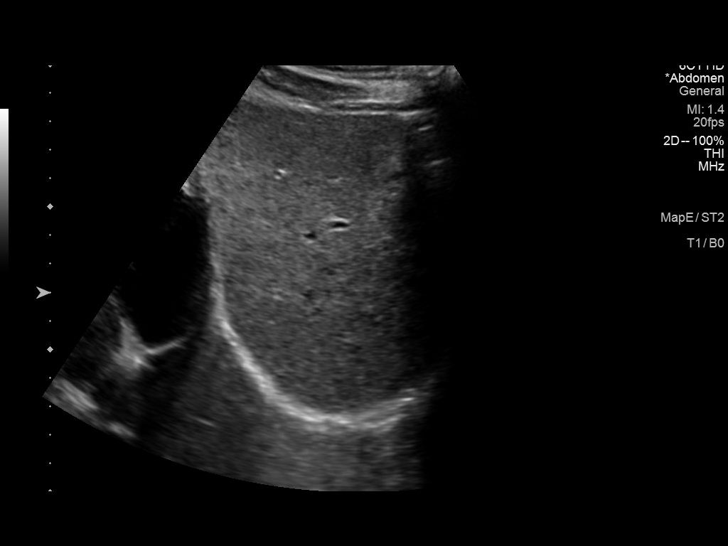
[im 35/53]
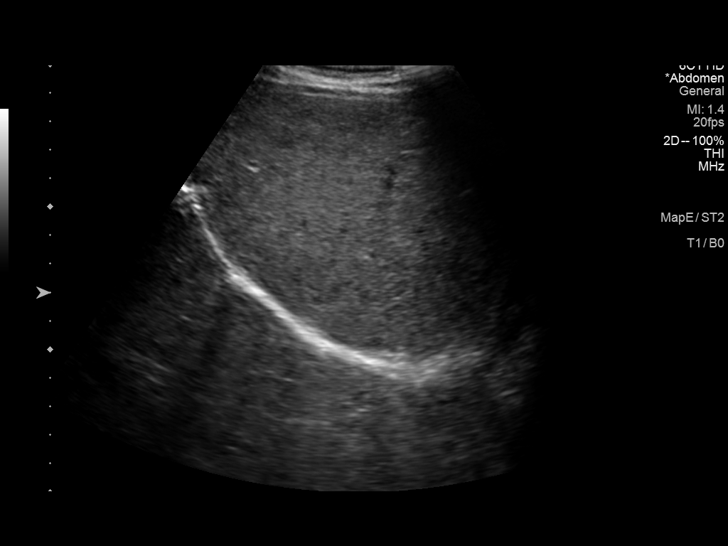
[im 40/53]
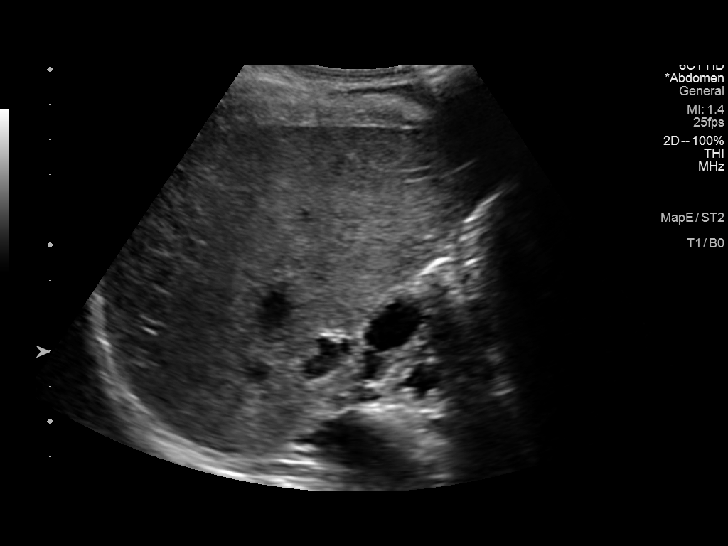
[im 44/53]
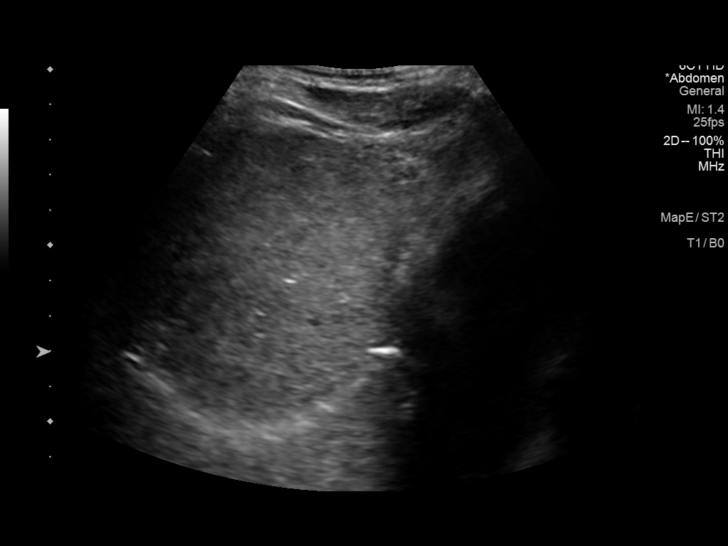
[im 48/53]
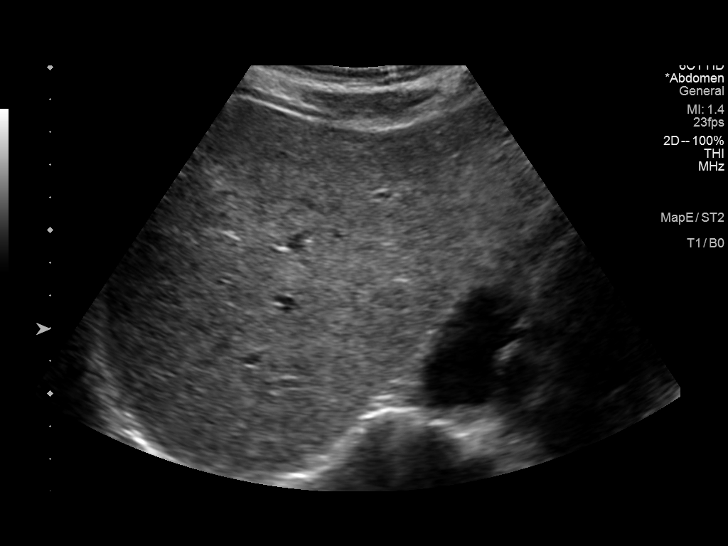
[im 53/53]
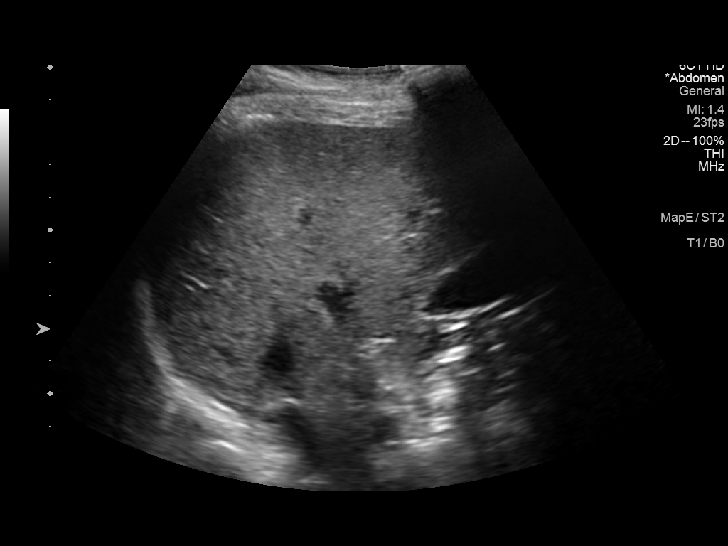

[14 of 25 positions shown; findings below may reference images not displayed]

FINDINGS: Gallbladder:

No gallstones or wall thickening visualized. No sonographic Murphy
sign noted by sonographer.

Common bile duct:

Diameter: 4.4 mm

Liver:

No focal lesion identified. Within normal limits in parenchymal
echogenicity. Portal vein is patent on color Doppler imaging with
normal direction of blood flow towards the liver.

Other: None.
IMPRESSION: Normal right upper quadrant ultrasound.

## 2022-06-03 DIAGNOSIS — E7800 Pure hypercholesterolemia, unspecified: Secondary | ICD-10-CM | POA: Diagnosis not present

## 2022-06-03 DIAGNOSIS — E1169 Type 2 diabetes mellitus with other specified complication: Secondary | ICD-10-CM | POA: Diagnosis not present

## 2022-06-03 DIAGNOSIS — I10 Essential (primary) hypertension: Secondary | ICD-10-CM | POA: Diagnosis not present

## 2022-07-02 DIAGNOSIS — E7800 Pure hypercholesterolemia, unspecified: Secondary | ICD-10-CM | POA: Diagnosis not present

## 2022-07-02 DIAGNOSIS — I10 Essential (primary) hypertension: Secondary | ICD-10-CM | POA: Diagnosis not present

## 2022-07-02 DIAGNOSIS — E1169 Type 2 diabetes mellitus with other specified complication: Secondary | ICD-10-CM | POA: Diagnosis not present

## 2022-07-07 DIAGNOSIS — E7800 Pure hypercholesterolemia, unspecified: Secondary | ICD-10-CM | POA: Diagnosis not present

## 2022-07-07 DIAGNOSIS — E1165 Type 2 diabetes mellitus with hyperglycemia: Secondary | ICD-10-CM | POA: Diagnosis not present

## 2022-07-07 DIAGNOSIS — F324 Major depressive disorder, single episode, in partial remission: Secondary | ICD-10-CM | POA: Diagnosis not present

## 2022-07-07 DIAGNOSIS — E1142 Type 2 diabetes mellitus with diabetic polyneuropathy: Secondary | ICD-10-CM | POA: Diagnosis not present

## 2022-07-07 DIAGNOSIS — I10 Essential (primary) hypertension: Secondary | ICD-10-CM | POA: Diagnosis not present

## 2022-07-07 DIAGNOSIS — E1169 Type 2 diabetes mellitus with other specified complication: Secondary | ICD-10-CM | POA: Diagnosis not present

## 2022-08-01 DIAGNOSIS — E7800 Pure hypercholesterolemia, unspecified: Secondary | ICD-10-CM | POA: Diagnosis not present

## 2022-08-01 DIAGNOSIS — I10 Essential (primary) hypertension: Secondary | ICD-10-CM | POA: Diagnosis not present

## 2022-08-01 DIAGNOSIS — E1169 Type 2 diabetes mellitus with other specified complication: Secondary | ICD-10-CM | POA: Diagnosis not present

## 2022-09-02 DIAGNOSIS — E7800 Pure hypercholesterolemia, unspecified: Secondary | ICD-10-CM | POA: Diagnosis not present

## 2022-09-02 DIAGNOSIS — I10 Essential (primary) hypertension: Secondary | ICD-10-CM | POA: Diagnosis not present

## 2022-09-02 DIAGNOSIS — E1169 Type 2 diabetes mellitus with other specified complication: Secondary | ICD-10-CM | POA: Diagnosis not present

## 2022-09-30 DIAGNOSIS — E1169 Type 2 diabetes mellitus with other specified complication: Secondary | ICD-10-CM | POA: Diagnosis not present

## 2022-09-30 DIAGNOSIS — E7800 Pure hypercholesterolemia, unspecified: Secondary | ICD-10-CM | POA: Diagnosis not present

## 2022-09-30 DIAGNOSIS — I10 Essential (primary) hypertension: Secondary | ICD-10-CM | POA: Diagnosis not present

## 2022-10-02 ENCOUNTER — Other Ambulatory Visit: Payer: Self-pay | Admitting: Nurse Practitioner

## 2022-10-02 DIAGNOSIS — K7401 Hepatic fibrosis, early fibrosis: Secondary | ICD-10-CM

## 2022-10-02 DIAGNOSIS — B181 Chronic viral hepatitis B without delta-agent: Secondary | ICD-10-CM | POA: Diagnosis not present

## 2022-10-02 DIAGNOSIS — K760 Fatty (change of) liver, not elsewhere classified: Secondary | ICD-10-CM | POA: Diagnosis not present

## 2022-10-13 DIAGNOSIS — B181 Chronic viral hepatitis B without delta-agent: Secondary | ICD-10-CM | POA: Diagnosis not present

## 2022-10-13 DIAGNOSIS — K7401 Hepatic fibrosis, early fibrosis: Secondary | ICD-10-CM | POA: Diagnosis not present

## 2022-10-27 ENCOUNTER — Ambulatory Visit
Admission: RE | Admit: 2022-10-27 | Discharge: 2022-10-27 | Disposition: A | Discharge status: Home / Self Care | Payer: Medicare HMO | Source: Ambulatory Visit | Attending: Nurse Practitioner | Admitting: Nurse Practitioner

## 2022-10-27 DIAGNOSIS — B189 Chronic viral hepatitis, unspecified: Secondary | ICD-10-CM | POA: Diagnosis not present

## 2022-10-27 DIAGNOSIS — B181 Chronic viral hepatitis B without delta-agent: Secondary | ICD-10-CM

## 2022-10-27 DIAGNOSIS — K7401 Hepatic fibrosis, early fibrosis: Secondary | ICD-10-CM

## 2022-11-03 IMAGING — US US ABDOMEN LIMITED
1 series · 14 of 25 positions shown · non-contrast
Comparison: March 04, 2021

CLINICAL DATA: Hepatitis-B.

EXAM:
ULTRASOUND ABDOMEN LIMITED RIGHT UPPER QUADRANT

[Series 1: us abdomen limited · 0.15mm/px · 14 of 47 slices shown]
[im 1/47]
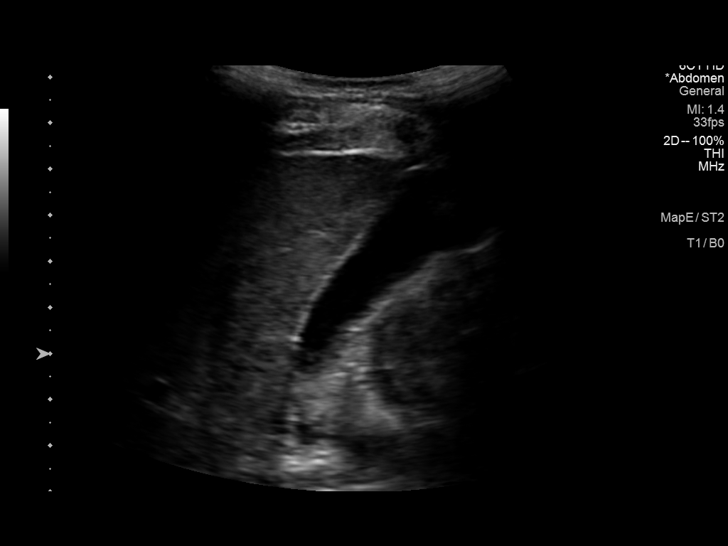
[im 4/47]
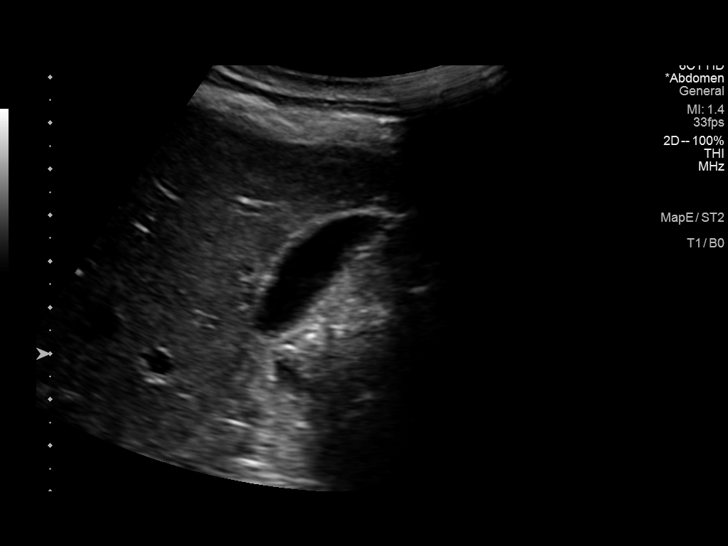
[im 8/47]
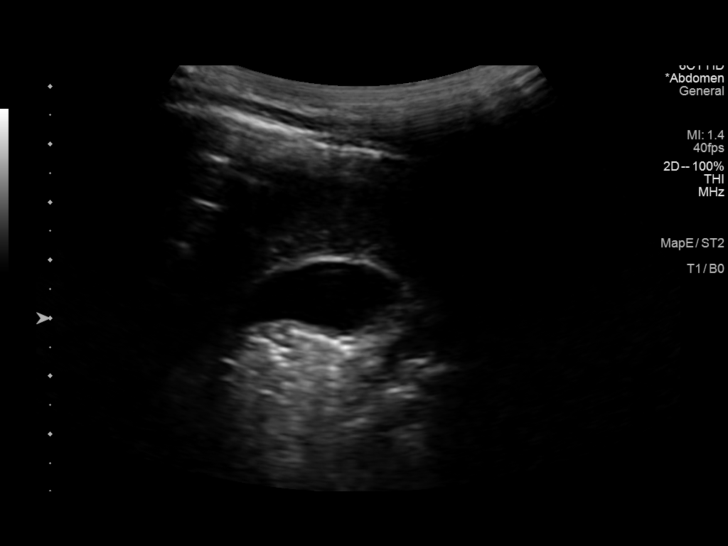
[im 12/47]
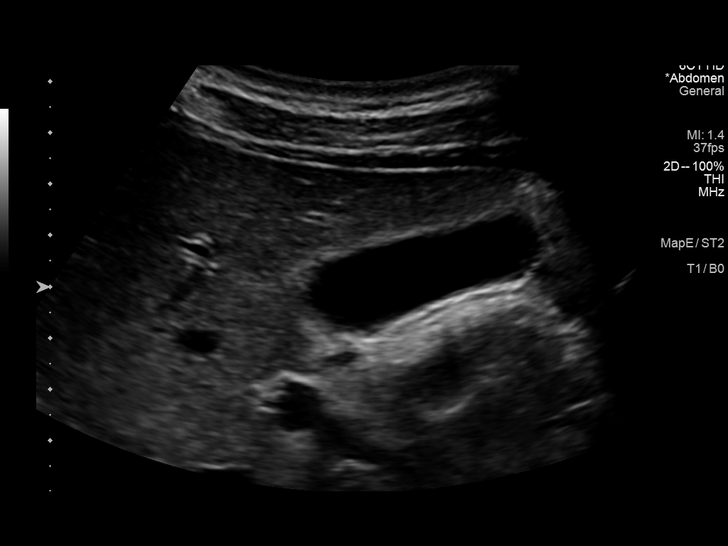
[im 16/47]
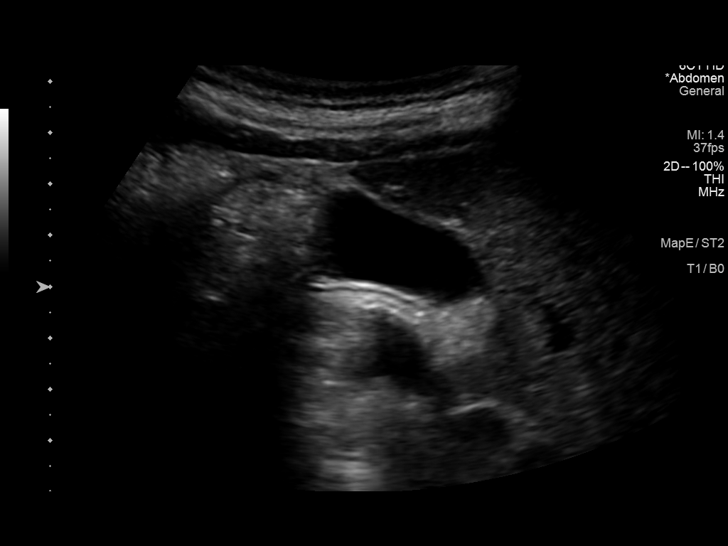
[im 18/47]
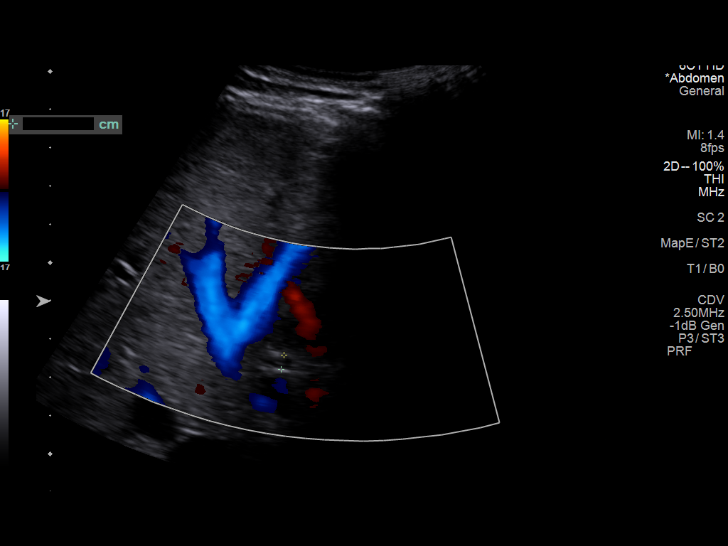
[im 22/47]
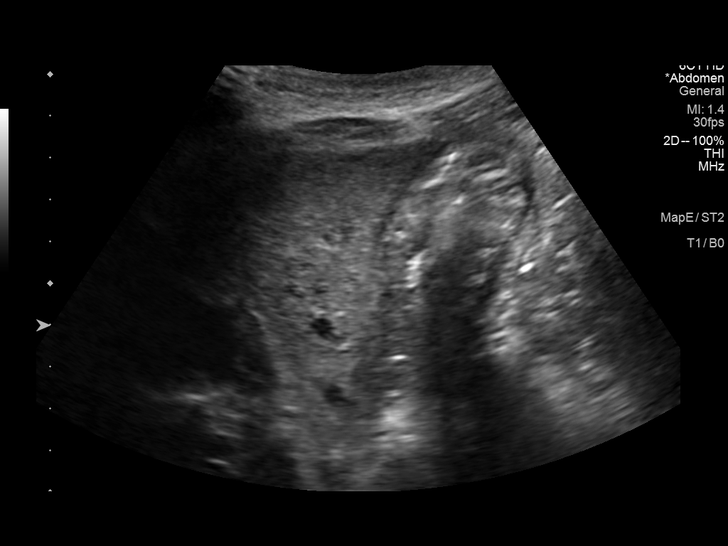
[im 25/47]
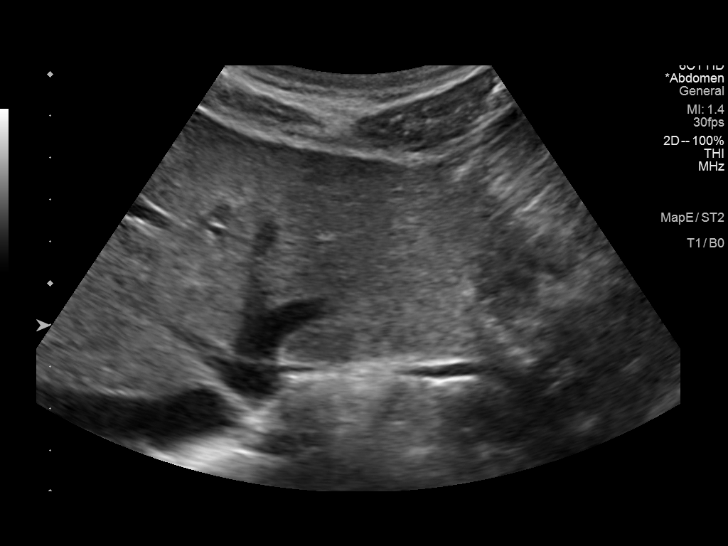
[im 29/47]
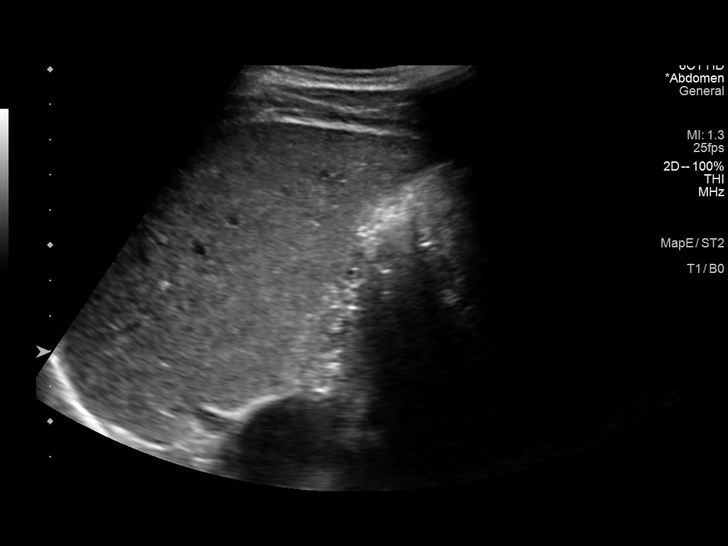
[im 31/47]
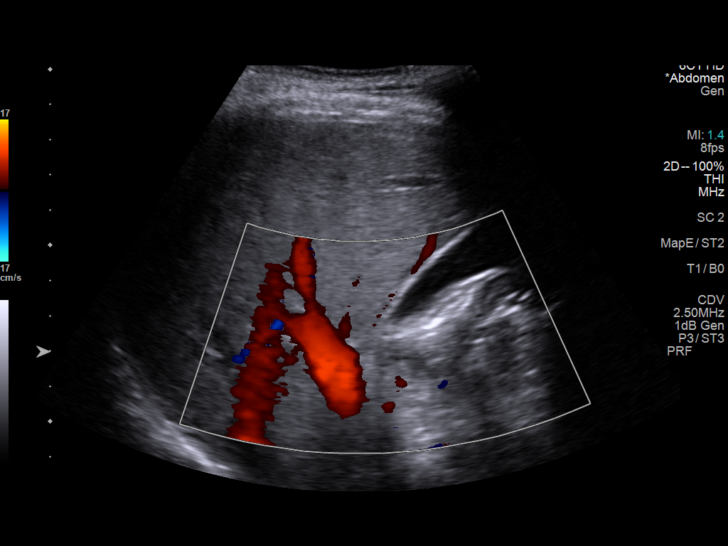
[im 35/47]
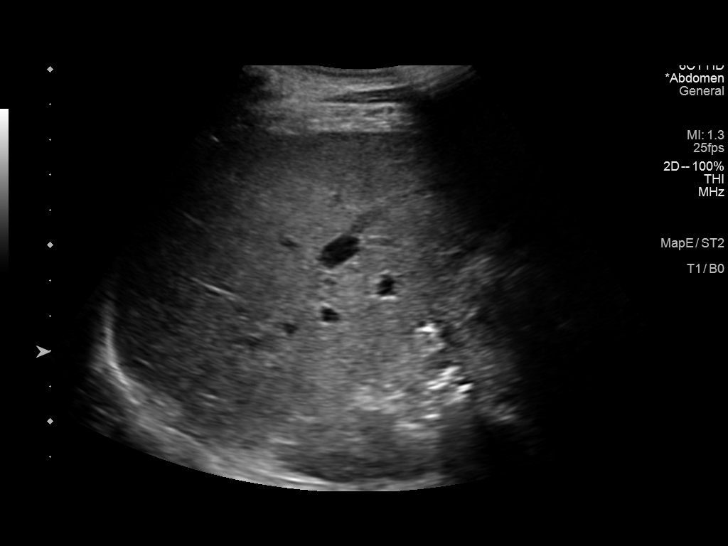
[im 39/47]
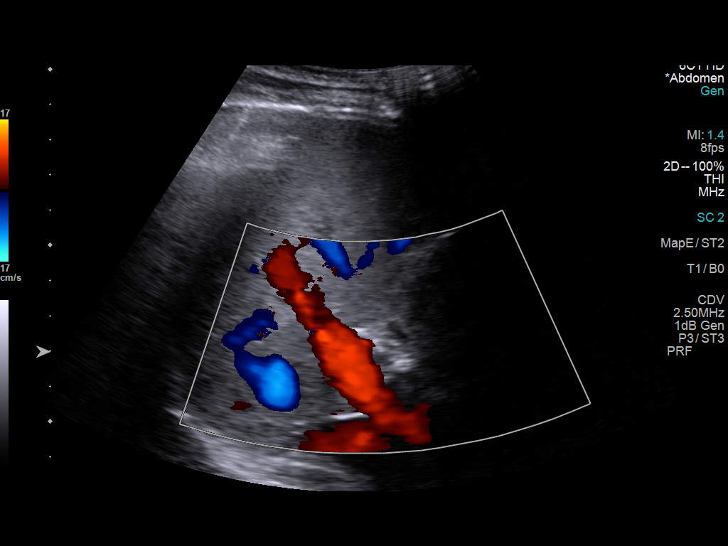
[im 43/47]
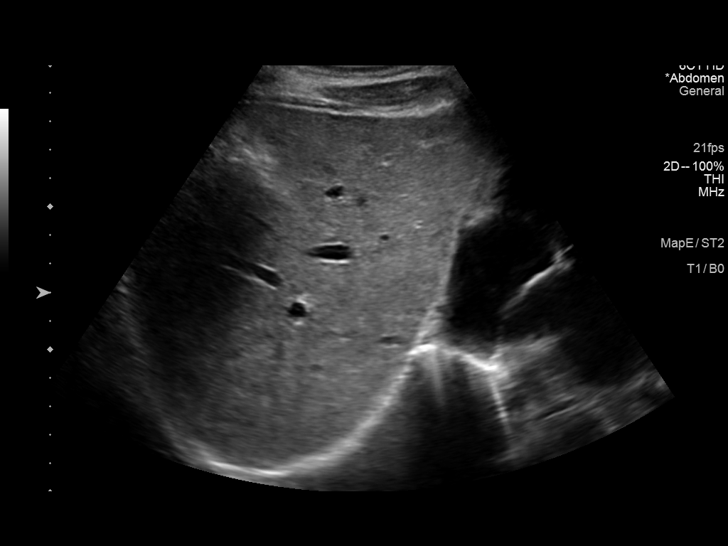
[im 47/47]
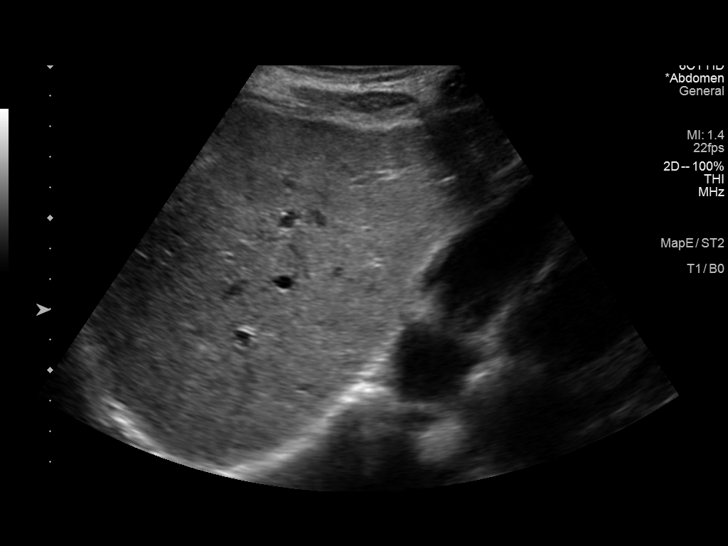

[14 of 25 positions shown; findings below may reference images not displayed]

FINDINGS: Gallbladder:

No gallstones or wall thickening visualized. No sonographic Murphy
sign noted by sonographer.

Common bile duct:

Diameter: 3.8 mm

Liver:

No focal lesion identified. Within normal limits in parenchymal
echogenicity. Portal vein is patent on color Doppler imaging with
normal direction of blood flow towards the liver.

Other: None.
IMPRESSION: 1. Normal study. No masses or lesions identified in the liver. No
evidence of cirrhosis on today's study.

## 2023-01-07 DIAGNOSIS — B181 Chronic viral hepatitis B without delta-agent: Secondary | ICD-10-CM | POA: Diagnosis not present

## 2023-01-26 DIAGNOSIS — G629 Polyneuropathy, unspecified: Secondary | ICD-10-CM | POA: Diagnosis not present

## 2023-01-26 DIAGNOSIS — F324 Major depressive disorder, single episode, in partial remission: Secondary | ICD-10-CM | POA: Diagnosis not present

## 2023-01-26 DIAGNOSIS — E1159 Type 2 diabetes mellitus with other circulatory complications: Secondary | ICD-10-CM | POA: Diagnosis not present

## 2023-01-26 DIAGNOSIS — E1142 Type 2 diabetes mellitus with diabetic polyneuropathy: Secondary | ICD-10-CM | POA: Diagnosis not present

## 2023-01-26 DIAGNOSIS — Z23 Encounter for immunization: Secondary | ICD-10-CM | POA: Diagnosis not present

## 2023-01-26 DIAGNOSIS — I10 Essential (primary) hypertension: Secondary | ICD-10-CM | POA: Diagnosis not present

## 2023-01-26 DIAGNOSIS — Z0000 Encounter for general adult medical examination without abnormal findings: Secondary | ICD-10-CM | POA: Diagnosis not present

## 2023-01-26 DIAGNOSIS — E1165 Type 2 diabetes mellitus with hyperglycemia: Secondary | ICD-10-CM | POA: Diagnosis not present

## 2023-01-26 DIAGNOSIS — E7800 Pure hypercholesterolemia, unspecified: Secondary | ICD-10-CM | POA: Diagnosis not present

## 2023-01-26 DIAGNOSIS — Z125 Encounter for screening for malignant neoplasm of prostate: Secondary | ICD-10-CM | POA: Diagnosis not present

## 2023-01-26 DIAGNOSIS — F4310 Post-traumatic stress disorder, unspecified: Secondary | ICD-10-CM | POA: Diagnosis not present

## 2023-03-09 DIAGNOSIS — E1159 Type 2 diabetes mellitus with other circulatory complications: Secondary | ICD-10-CM | POA: Diagnosis not present

## 2023-03-09 DIAGNOSIS — E119 Type 2 diabetes mellitus without complications: Secondary | ICD-10-CM | POA: Diagnosis not present

## 2023-03-09 DIAGNOSIS — H538 Other visual disturbances: Secondary | ICD-10-CM | POA: Diagnosis not present

## 2023-03-09 DIAGNOSIS — I10 Essential (primary) hypertension: Secondary | ICD-10-CM | POA: Diagnosis not present

## 2023-03-24 DIAGNOSIS — H5203 Hypermetropia, bilateral: Secondary | ICD-10-CM | POA: Diagnosis not present

## 2023-03-24 DIAGNOSIS — E119 Type 2 diabetes mellitus without complications: Secondary | ICD-10-CM | POA: Diagnosis not present

## 2023-03-24 DIAGNOSIS — H52203 Unspecified astigmatism, bilateral: Secondary | ICD-10-CM | POA: Diagnosis not present

## 2023-03-24 DIAGNOSIS — H2513 Age-related nuclear cataract, bilateral: Secondary | ICD-10-CM | POA: Diagnosis not present

## 2023-04-03 ENCOUNTER — Other Ambulatory Visit: Payer: Self-pay | Admitting: Nurse Practitioner

## 2023-04-03 DIAGNOSIS — B181 Chronic viral hepatitis B without delta-agent: Secondary | ICD-10-CM

## 2023-04-03 DIAGNOSIS — K760 Fatty (change of) liver, not elsewhere classified: Secondary | ICD-10-CM | POA: Diagnosis not present

## 2023-04-03 DIAGNOSIS — K7401 Hepatic fibrosis, early fibrosis: Secondary | ICD-10-CM

## 2023-04-09 ENCOUNTER — Ambulatory Visit
Admission: RE | Admit: 2023-04-09 | Discharge: 2023-04-09 | Disposition: A | Discharge status: Home / Self Care | Payer: Medicare HMO | Source: Ambulatory Visit | Attending: Nurse Practitioner | Admitting: Nurse Practitioner

## 2023-04-09 DIAGNOSIS — B181 Chronic viral hepatitis B without delta-agent: Secondary | ICD-10-CM

## 2023-04-09 DIAGNOSIS — K7401 Hepatic fibrosis, early fibrosis: Secondary | ICD-10-CM

## 2023-04-09 DIAGNOSIS — B1910 Unspecified viral hepatitis B without hepatic coma: Secondary | ICD-10-CM | POA: Diagnosis not present

## 2023-04-09 DIAGNOSIS — K760 Fatty (change of) liver, not elsewhere classified: Secondary | ICD-10-CM

## 2023-04-28 DIAGNOSIS — I10 Essential (primary) hypertension: Secondary | ICD-10-CM | POA: Diagnosis not present

## 2023-04-28 DIAGNOSIS — E1169 Type 2 diabetes mellitus with other specified complication: Secondary | ICD-10-CM | POA: Diagnosis not present

## 2023-04-28 DIAGNOSIS — E1159 Type 2 diabetes mellitus with other circulatory complications: Secondary | ICD-10-CM | POA: Diagnosis not present

## 2023-04-28 DIAGNOSIS — E1165 Type 2 diabetes mellitus with hyperglycemia: Secondary | ICD-10-CM | POA: Diagnosis not present

## 2023-07-29 DIAGNOSIS — I10 Essential (primary) hypertension: Secondary | ICD-10-CM | POA: Diagnosis not present

## 2023-07-29 DIAGNOSIS — M25512 Pain in left shoulder: Secondary | ICD-10-CM | POA: Diagnosis not present

## 2023-07-29 DIAGNOSIS — E1169 Type 2 diabetes mellitus with other specified complication: Secondary | ICD-10-CM | POA: Diagnosis not present

## 2023-07-29 DIAGNOSIS — E7800 Pure hypercholesterolemia, unspecified: Secondary | ICD-10-CM | POA: Diagnosis not present

## 2023-07-29 DIAGNOSIS — G629 Polyneuropathy, unspecified: Secondary | ICD-10-CM | POA: Diagnosis not present

## 2023-11-04 ENCOUNTER — Other Ambulatory Visit: Payer: Self-pay | Admitting: Nurse Practitioner

## 2023-11-04 DIAGNOSIS — B181 Chronic viral hepatitis B without delta-agent: Secondary | ICD-10-CM

## 2023-11-11 ENCOUNTER — Inpatient Hospital Stay
Admission: RE | Admit: 2023-11-11 | Discharge: 2023-11-11 | Discharge status: Home / Self Care | Source: Ambulatory Visit | Attending: Nurse Practitioner

## 2023-11-11 DIAGNOSIS — B181 Chronic viral hepatitis B without delta-agent: Secondary | ICD-10-CM | POA: Diagnosis not present

## 2024-01-28 DIAGNOSIS — K760 Fatty (change of) liver, not elsewhere classified: Secondary | ICD-10-CM | POA: Diagnosis not present

## 2024-01-28 DIAGNOSIS — F324 Major depressive disorder, single episode, in partial remission: Secondary | ICD-10-CM | POA: Diagnosis not present

## 2024-01-28 DIAGNOSIS — F4310 Post-traumatic stress disorder, unspecified: Secondary | ICD-10-CM | POA: Diagnosis not present

## 2024-01-28 DIAGNOSIS — I10 Essential (primary) hypertension: Secondary | ICD-10-CM | POA: Diagnosis not present

## 2024-01-28 DIAGNOSIS — E1142 Type 2 diabetes mellitus with diabetic polyneuropathy: Secondary | ICD-10-CM | POA: Diagnosis not present

## 2024-01-28 DIAGNOSIS — K219 Gastro-esophageal reflux disease without esophagitis: Secondary | ICD-10-CM | POA: Diagnosis not present

## 2024-01-28 DIAGNOSIS — Z59861 Financial insecurity, difficulty paying for utilities: Secondary | ICD-10-CM | POA: Diagnosis not present

## 2024-01-28 DIAGNOSIS — E785 Hyperlipidemia, unspecified: Secondary | ICD-10-CM | POA: Diagnosis not present

## 2024-01-28 DIAGNOSIS — Z7984 Long term (current) use of oral hypoglycemic drugs: Secondary | ICD-10-CM | POA: Diagnosis not present

## 2024-02-11 ENCOUNTER — Other Ambulatory Visit: Payer: Self-pay | Admitting: Nurse Practitioner

## 2024-02-11 DIAGNOSIS — B181 Chronic viral hepatitis B without delta-agent: Secondary | ICD-10-CM

## 2024-02-11 DIAGNOSIS — K760 Fatty (change of) liver, not elsewhere classified: Secondary | ICD-10-CM | POA: Diagnosis not present

## 2024-02-11 DIAGNOSIS — K7401 Hepatic fibrosis, early fibrosis: Secondary | ICD-10-CM | POA: Diagnosis not present

## 2024-02-11 DIAGNOSIS — R634 Abnormal weight loss: Secondary | ICD-10-CM | POA: Diagnosis not present

## 2024-02-18 ENCOUNTER — Ambulatory Visit
Admission: RE | Admit: 2024-02-18 | Discharge: 2024-02-18 | Disposition: A | Source: Ambulatory Visit | Attending: Nurse Practitioner

## 2024-02-18 DIAGNOSIS — K760 Fatty (change of) liver, not elsewhere classified: Secondary | ICD-10-CM | POA: Diagnosis not present

## 2024-02-18 DIAGNOSIS — K7401 Hepatic fibrosis, early fibrosis: Secondary | ICD-10-CM

## 2024-02-18 DIAGNOSIS — B181 Chronic viral hepatitis B without delta-agent: Secondary | ICD-10-CM

## 2024-02-23 DIAGNOSIS — G629 Polyneuropathy, unspecified: Secondary | ICD-10-CM | POA: Diagnosis not present

## 2024-02-23 DIAGNOSIS — I10 Essential (primary) hypertension: Secondary | ICD-10-CM | POA: Diagnosis not present

## 2024-02-23 DIAGNOSIS — E1159 Type 2 diabetes mellitus with other circulatory complications: Secondary | ICD-10-CM | POA: Diagnosis not present

## 2024-02-23 DIAGNOSIS — F324 Major depressive disorder, single episode, in partial remission: Secondary | ICD-10-CM | POA: Diagnosis not present

## 2024-02-23 DIAGNOSIS — Z125 Encounter for screening for malignant neoplasm of prostate: Secondary | ICD-10-CM | POA: Diagnosis not present

## 2024-02-23 DIAGNOSIS — B181 Chronic viral hepatitis B without delta-agent: Secondary | ICD-10-CM | POA: Diagnosis not present

## 2024-02-23 DIAGNOSIS — Z0000 Encounter for general adult medical examination without abnormal findings: Secondary | ICD-10-CM | POA: Diagnosis not present

## 2024-02-23 DIAGNOSIS — E7800 Pure hypercholesterolemia, unspecified: Secondary | ICD-10-CM | POA: Diagnosis not present

## 2024-02-23 DIAGNOSIS — F4310 Post-traumatic stress disorder, unspecified: Secondary | ICD-10-CM | POA: Diagnosis not present

## 2024-02-23 DIAGNOSIS — Z8619 Personal history of other infectious and parasitic diseases: Secondary | ICD-10-CM | POA: Diagnosis not present
